# Patient Record
Sex: Female | Born: 1970 | Race: White | Hispanic: No | Marital: Married | Smoking: Never smoker
Health system: Southern US, Community
[De-identification: ages and names within clinical notes are randomized; demographics above are authoritative.]

## PROBLEM LIST (undated history)

## (undated) DIAGNOSIS — J189 Pneumonia, unspecified organism: Secondary | ICD-10-CM

## (undated) DIAGNOSIS — T7840XA Allergy, unspecified, initial encounter: Secondary | ICD-10-CM

## (undated) DIAGNOSIS — J40 Bronchitis, not specified as acute or chronic: Secondary | ICD-10-CM

## (undated) HISTORY — DX: Allergy, unspecified, initial encounter: T78.40XA

## (undated) HISTORY — DX: Bronchitis, not specified as acute or chronic: J40

## (undated) HISTORY — DX: Pneumonia, unspecified organism: J18.9

---

## 1998-09-08 HISTORY — PX: TONSILLECTOMY: SUR1361

## 2007-09-09 HISTORY — PX: CHOLECYSTECTOMY: SHX55

## 2019-08-15 ENCOUNTER — Other Ambulatory Visit: Payer: Self-pay | Admitting: *Deleted

## 2019-08-15 DIAGNOSIS — N6310 Unspecified lump in the right breast, unspecified quadrant: Secondary | ICD-10-CM

## 2019-08-18 ENCOUNTER — Telehealth (HOSPITAL_COMMUNITY): Payer: Self-pay

## 2019-08-18 NOTE — Telephone Encounter (Signed)
Telephoned patient at home number. Left voice message to call [facility].

## 2019-09-08 ENCOUNTER — Encounter (HOSPITAL_COMMUNITY): Payer: Self-pay

## 2019-09-08 ENCOUNTER — Ambulatory Visit: Payer: Self-pay

## 2019-09-08 ENCOUNTER — Ambulatory Visit
Admission: RE | Admit: 2019-09-08 | Discharge: 2019-09-08 | Disposition: A | Discharge status: Home / Self Care | Payer: No Typology Code available for payment source | Source: Ambulatory Visit | Attending: Obstetrics and Gynecology | Admitting: Obstetrics and Gynecology

## 2019-09-08 ENCOUNTER — Other Ambulatory Visit: Payer: Self-pay

## 2019-09-08 ENCOUNTER — Ambulatory Visit (HOSPITAL_COMMUNITY)
Admission: RE | Admit: 2019-09-08 | Discharge: 2019-09-08 | Disposition: A | Discharge status: Home / Self Care | Payer: Medicaid Other | Source: Ambulatory Visit | Attending: Obstetrics and Gynecology | Admitting: Obstetrics and Gynecology

## 2019-09-08 DIAGNOSIS — N6310 Unspecified lump in the right breast, unspecified quadrant: Secondary | ICD-10-CM

## 2019-09-08 DIAGNOSIS — N644 Mastodynia: Secondary | ICD-10-CM | POA: Insufficient documentation

## 2019-09-08 DIAGNOSIS — Z1239 Encounter for other screening for malignant neoplasm of breast: Secondary | ICD-10-CM

## 2019-09-08 NOTE — Patient Instructions (Addendum)
Explained breast self awareness with Felicity Coyer. Unable to complete Pap smear in [facility] today due to patient has [facility]. Patient scheduled appointment to have Pap smear completed at the free Pap smear screening on Monday, Jan 30, 2020 at 0830. Referred patient to the [facility] for a diagnostic mammogram. Appointment scheduled for Thursday, September 08, 2019 at [place]. Patient aware of appointments and will be there. Susy Flug verbalized understanding.  Markeisha Mancias, Arvil Chaco, RN 8:28 AM

## 2019-09-08 NOTE — Progress Notes (Addendum)
Complaints of right breast lump and pain x 6 months. Patient states the pain comes and goes. Patient rates the pain at a 5-6.   Pap Smear: Pap smear not completed today. Last Pap smear was 18 years ago and normal per patient. Per patient has a history of an abnormal Pap smear around 1996 that a repeat Pap smear was completed for follow-up. Patient stated she has had one or two normal Pap smears since abnormal. Unable to complete Pap smear in [facility] due to patient has [facility]. Patient scheduled appointment to have Pap smear completed at the free Pap smear screening on Monday, Jan 30, 2020 at 0830. No Pap smear results are in Epic.  Physical exam: Breasts Breasts symmetrical. No skin abnormalities bilateral breasts. No nipple retraction bilateral breasts. No nipple discharge bilateral breasts. No lymphadenopathy. No lumps palpated bilateral breasts. Unable to palpate a lump in patients area of concern. Complaints of right outer breast tenderness on exam. Referred patient to the [facility] for a diagnostic mammogram. Appointment scheduled for Thursday, September 08, 2019 at [place].        Pelvic/Bimanual No Pap smear completed today since patient has [facility] and not covered by [employer].  Smoking History: Patient has never smoked.  Patient Navigation: Patient education provided. Access to services provided for patient through [facility] program.   Breast and Cervical Cancer Risk Assessment: Patient has a family history of her mother, 2 paternal aunts, and maternal great grandmother having breast cancer. Patient has no known genetic mutations or history of radiation treatment to the chest before age 38. Patient has no history of cervical dysplasia, immunocompromised, or DES exposure in-utero.  Risk Assessment    Risk Scores      09/08/2019   Last edited by: Loletta Parish, RN   5-year risk: 1.5 %   Lifetime risk: 15.9 %

## 2020-01-30 ENCOUNTER — Ambulatory Visit: Payer: Self-pay

## 2020-07-11 ENCOUNTER — Ambulatory Visit
Admission: RE | Admit: 2020-07-11 | Discharge: 2020-07-11 | Disposition: A | Discharge status: Home / Self Care | Payer: No Typology Code available for payment source | Source: Ambulatory Visit | Attending: Family Medicine | Admitting: Family Medicine

## 2020-07-11 ENCOUNTER — Ambulatory Visit (INDEPENDENT_AMBULATORY_CARE_PROVIDER_SITE_OTHER): Payer: No Typology Code available for payment source

## 2020-07-11 VITALS — BP 157/103 | HR 110 | Temp 98.7°F | Resp 18 | Ht 65.0 in | Wt 228.0 lb

## 2020-07-11 DIAGNOSIS — J069 Acute upper respiratory infection, unspecified: Secondary | ICD-10-CM

## 2020-07-11 DIAGNOSIS — Z20822 Contact with and (suspected) exposure to covid-19: Secondary | ICD-10-CM

## 2020-07-11 DIAGNOSIS — R062 Wheezing: Secondary | ICD-10-CM

## 2020-07-11 DIAGNOSIS — R6883 Chills (without fever): Secondary | ICD-10-CM

## 2020-07-11 DIAGNOSIS — R059 Cough, unspecified: Secondary | ICD-10-CM

## 2020-07-11 DIAGNOSIS — R5383 Other fatigue: Secondary | ICD-10-CM

## 2020-07-11 DIAGNOSIS — Z01812 Encounter for preprocedural laboratory examination: Secondary | ICD-10-CM

## 2020-07-11 DIAGNOSIS — R000 Tachycardia, unspecified: Secondary | ICD-10-CM

## 2020-07-11 MED ORDER — DEXAMETHASONE SODIUM PHOSPHATE 10 MG/ML IJ SOLN
10.0000 mg | Freq: Once | INTRAMUSCULAR | Status: AC
  Administered 2020-07-11: 10 mg via INTRAMUSCULAR

## 2020-07-11 MED ORDER — BENZONATATE 100 MG PO CAPS: 100.0000 mg | ORAL_CAPSULE | Freq: Three times a day (TID) | ORAL | 0 refills | Status: DC

## 2020-07-11 MED ORDER — FLUTICASONE-SALMETEROL 250-50 MCG/DOSE IN AEPB: 1.0000 | INHALATION_SPRAY | Freq: Two times a day (BID) | RESPIRATORY_TRACT | 0 refills | Status: DC

## 2020-07-11 MED ORDER — PREDNISONE 10 MG (21) PO TBPK: ORAL_TABLET | Freq: Every day | ORAL | 0 refills | Status: AC

## 2020-07-11 MED ORDER — ALBUTEROL SULFATE HFA 108 (90 BASE) MCG/ACT IN AERS: 2.0000 | INHALATION_SPRAY | RESPIRATORY_TRACT | 0 refills | Status: DC | PRN

## 2020-07-11 MED ORDER — AZITHROMYCIN 250 MG PO TABS: 250.0000 mg | ORAL_TABLET | Freq: Every day | ORAL | 0 refills | Status: DC

## 2020-07-11 NOTE — Discharge Instructions (Addendum)
I have sent in [place] for you to use one capsule every 8 hours as needed for cough.  I have sent in an albuterol inhaler for you to use 2 puffs every 4-6 hours as needed for cough, shortness of breath, wheezing.  I have sent in an advair inhaler for you to use 2 puffs twice a day as a steroid inhaler  I have sent in azithromycin for you to take. Take 2 tablets today, then one tablet daily for the next 4 days.  I have sent in a prednisone taper for you to take for 6 days. 6 tablets on day one, 5 tablets on day two, 4 tablets on day three, 3 tablets on day four, 2 tablets on day five, and 1 tablet on day six.  Follow up with this office or with primary care if symptoms are persisting.  Follow up in the ER for high fever, trouble swallowing, trouble breathing, other concerning symptoms.

## 2020-07-11 NOTE — ED Provider Notes (Signed)
[facility]   [id number] 07/11/20 Arrival Time: 7253   CC: COVID symptoms  SUBJECTIVE: History from: patient.  Dana Randolph is a 49 y.o. female who presents with abrupt onset of nasal congestion, PND, fatigue, wheezing, and persistent dry cough for 4 days. Denies sick exposure to COVID, flu or strep. Denies recent travel. Has negative history of Covid. Has completed Covid vaccines. Has used her wife's duoneb medication x 2 since last night. Has not taken other OTC medications for this. There are no aggravating or alleviating factors. Denies previous symptoms in the past. Denies fever, sinus pain, rhinorrhea, sore throat, nausea, changes in bowel or bladder habits.    ROS: As per HPI.  All other pertinent ROS negative.     History reviewed. No pertinent past medical history. Past Surgical History:  Procedure Laterality Date  . CHOLECYSTECTOMY  2009  . TONSILLECTOMY  2000   Allergies  Allergen Reactions  . Aspirin Anaphylaxis    Throat swelling  . Penicillins Anaphylaxis    Throat swelling  . Sudafed [Pseudoephedrine] Anaphylaxis    Throat swelling  . Codeine Nausea And Vomiting   No current facility-administered medications on file prior to encounter.   No current outpatient medications on file prior to encounter.   Social History   Socioeconomic History  . Marital status: Significant Other    Spouse name: Not on file  . Number of children: Not on file  . Years of education: Not on file  . Highest education level: Bachelor's degree (e.g., BA, AB, BS)  Occupational History  . Not on file  Tobacco Use  . Smoking status: Never Smoker  . Smokeless tobacco: Never Used  Vaping Use  . Vaping Use: Never used  Substance and Sexual Activity  . Alcohol use: Yes    Comment: occ  . Drug use: Never  . Sexual activity: Not Currently  Other Topics Concern  . Not on file  Social History Narrative  . Not on file   Social Determinants of Health   Financial  Resource Strain:   . Difficulty of Paying Living Expenses: Not on file  Food Insecurity:   . Worried About Charity fundraiser in the Last Year: Not on file  . Ran Out of Food in the Last Year: Not on file  Transportation Needs: No Transportation Needs  . Lack of Transportation (Medical): No  . Lack of Transportation (Non-Medical): No  Physical Activity:   . Days of Exercise per Week: Not on file  . Minutes of Exercise per Session: Not on file  Stress:   . Feeling of Stress : Not on file  Social Connections:   . Frequency of Communication with Friends and Family: Not on file  . Frequency of Social Gatherings with Friends and Family: Not on file  . Attends Religious Services: Not on file  . Active Member of Clubs or Organizations: Not on file  . Attends Archivist Meetings: Not on file  . Marital Status: Not on file  Intimate Partner Violence:   . Fear of Current or Ex-Partner: Not on file  . Emotionally Abused: Not on file  . Physically Abused: Not on file  . Sexually Abused: Not on file   Family History  Problem Relation Age of Onset  . Breast cancer Mother   . Diabetes Father   . COPD Father   . Hypertension Father   . Heart attack Father   . Breast cancer Paternal Aunt   . Breast cancer  Paternal Aunt   . Breast cancer Maternal Great-grandmother     OBJECTIVE:  Vitals:   07/11/20 1258 07/11/20 1259  BP: (!) 157/103   Pulse: (!) 110   Resp: 18   Temp: 98.7 F (37.1 C)   TempSrc: Oral   SpO2: 95%   Weight:  228 lb (103.4 kg)  Height:  5\' 5"  (1.651 m)     General appearance: alert; appears fatigued, but nontoxic; speaking in full sentences and tolerating own secretions HEENT: NCAT; Ears: EACs clear, TMs pearly gray; Eyes: PERRL.  EOM grossly intact. Sinuses: nontender; Nose: nares patent without rhinorrhea, Throat: oropharynx erythematous, tonsils non erythematous or enlarged, uvula midline  Neck: supple without LAD Lungs: unlabored respirations,  symmetrical air entry; cough: moderate; no respiratory distress; wheezing to bilateral upper lobes, diminished lung sounds throughout the rest of the lungs Heart: regular rate and rhythm.  Radial pulses 2+ symmetrical bilaterally Skin: warm and dry Psychological: alert and cooperative; normal mood and affect  LABS:  No results found for this or any previous visit (from the past 24 hour(s)).   ASSESSMENT & PLAN:  1. Upper respiratory tract infection, unspecified type   2. Cough   3. Other fatigue   4. Wheezing   5. Chills   6. Tachycardia   7. Encounter for preoperative screening laboratory testing for COVID-19 virus     Meds ordered this encounter  Medications  . dexamethasone (DECADRON) injection 10 mg  . albuterol (VENTOLIN HFA) 108 (90 Base) MCG/ACT inhaler    Sig: Inhale 2 puffs into the lungs every 4 (four) hours as needed for wheezing or shortness of breath.    Dispense:  18 g    Refill:  0    Order Specific Question:   Supervising Provider    Answer:   Chase Picket [id number]  . Fluticasone-Salmeterol (ADVAIR DISKUS) 250-50 MCG/DOSE AEPB    Sig: Inhale 1 puff into the lungs in the morning and at bedtime.    Dispense:  60 each    Refill:  0    Order Specific Question:   Supervising Provider    Answer:   Chase Picket [id number]  . benzonatate (TESSALON) 100 MG capsule    Sig: Take 1 capsule (100 mg total) by mouth every 8 (eight) hours.    Dispense:  21 capsule    Refill:  0    Order Specific Question:   Supervising Provider    Answer:   Chase Picket [id number]  . azithromycin (ZITHROMAX) 250 MG tablet    Sig: Take 1 tablet (250 mg total) by mouth daily. Take first 2 tablets together, then 1 every day until finished.    Dispense:  6 tablet    Refill:  0    Order Specific Question:   Supervising Provider    Answer:   Chase Picket [id number]  . predniSONE (STERAPRED UNI-PAK 21 TAB) 10 MG (21) TBPK tablet    Sig: Take by mouth daily for 6 days. Take 6  tablets on day 1, 5 tablets on day 2, 4 tablets on day 3, 3 tablets on day 4, 2 tablets on day 5, 1 tablet on day 6    Dispense:  21 tablet    Refill:  0    Order Specific Question:   Supervising Provider    Answer:   Chase Picket [id number]    Decadron 10mg  IM in office today Albuterol prescribed Advair prescribed Steroid taper prescribed Tessalon Perles prescribed Azithromycin prescribed  COVID/FLU/RSV testing ordered.  It will take between 1-2 days for test results.  Someone will contact you regarding abnormal results.   Patient should remain in quarantine until they have received Covid results.  If negative you may resume normal activities (go back to work/school) while practicing hand hygiene, social distance, and mask wearing.  If positive, patient should remain in quarantine for 10 days from symptom onset AND greater than 72 hours after symptoms resolution (absence of fever without the use of fever-reducing medication and improvement in respiratory symptoms), whichever is longer Get plenty of rest and push fluids Use OTC zyrtec for nasal congestion, runny nose, and/or sore throat Use OTC flonase for nasal congestion and runny nose Use medications daily for symptom relief Use OTC medications like ibuprofen or tylenol as needed fever or pain Call or go to the ED if you have any new or worsening symptoms such as fever, worsening cough, shortness of breath, chest tightness, chest pain, turning blue, changes in mental status.  Reviewed expectations re: course of current medical issues. Questions answered. Outlined signs and symptoms indicating need for more acute intervention. Patient verbalized understanding. After Visit Summary given.         Faustino Congress, NP 07/11/20 1356

## 2020-07-11 NOTE — ED Triage Notes (Signed)
Pt reports having cough, runny nose and fatigue x4 days. This morning began to have some wheezing. No known covid exposure.

## 2020-07-13 LAB — COVID-19, FLU A+B AND RSV
Influenza A, NAA: NOT DETECTED
Influenza B, NAA: NOT DETECTED
RSV, NAA: NOT DETECTED
SARS-CoV-2, NAA: NOT DETECTED

## 2020-07-23 ENCOUNTER — Encounter: Payer: No Typology Code available for payment source | Admitting: Certified Nurse Midwife

## 2020-08-22 ENCOUNTER — Other Ambulatory Visit (HOSPITAL_COMMUNITY)
Admission: RE | Admit: 2020-08-22 | Discharge: 2020-08-22 | Disposition: A | Discharge status: Home / Self Care | Payer: Medicaid Other | Source: Ambulatory Visit | Attending: Certified Nurse Midwife | Admitting: Certified Nurse Midwife

## 2020-08-22 ENCOUNTER — Ambulatory Visit (INDEPENDENT_AMBULATORY_CARE_PROVIDER_SITE_OTHER): Payer: Medicaid Other | Admitting: Certified Nurse Midwife

## 2020-08-22 ENCOUNTER — Other Ambulatory Visit: Payer: Self-pay

## 2020-08-22 ENCOUNTER — Encounter: Payer: Self-pay | Admitting: Certified Nurse Midwife

## 2020-08-22 VITALS — BP 122/85 | HR 92 | Ht 65.0 in | Wt 252.3 lb

## 2020-08-22 DIAGNOSIS — Z124 Encounter for screening for malignant neoplasm of cervix: Secondary | ICD-10-CM | POA: Diagnosis present

## 2020-08-22 DIAGNOSIS — Z01419 Encounter for gynecological examination (general) (routine) without abnormal findings: Secondary | ICD-10-CM | POA: Insufficient documentation

## 2020-08-22 DIAGNOSIS — Z1231 Encounter for screening mammogram for malignant neoplasm of breast: Secondary | ICD-10-CM

## 2020-08-22 NOTE — Patient Instructions (Signed)

## 2020-08-22 NOTE — Progress Notes (Signed)
GYNECOLOGY ANNUAL PREVENTATIVE CARE ENCOUNTER NOTE  History:     Dana Randolph is a 49 y.o. G3P0 female here for a routine annual gynecologic exam.  Current complaints: heavy painful periods .   Denies abnormal vaginal bleeding, discharge, pelvic pain, problems with intercourse or other gynecologic concerns.     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 Contraception: none Last Pap: a long time ago negative per pt  Lasst mammogram: 09/08/19 Results were: normal  Obstetric History OB History  Gravida Para Term Preterm AB Living  3            SAB IAB Ectopic Multiple Live Births          3    # Outcome Date GA Lbr Len/2nd Randolph Sex Delivery Anes PTL Lv  3 Gravida           2 Gravida           1 Gravida             No past medical history on file.  Past Surgical History:  Procedure Laterality Date  . CHOLECYSTECTOMY  2009  . TONSILLECTOMY  2000    Current Outpatient Medications on File Prior to Visit  Medication Sig Dispense Refill  . albuterol (VENTOLIN HFA) 108 (90 Base) MCG/ACT inhaler Inhale 2 puffs into the lungs every 4 (four) hours as needed for wheezing or shortness of breath. 18 g 0  . Fluticasone-Salmeterol (ADVAIR DISKUS) 250-50 MCG/DOSE AEPB Inhale 1 puff into the lungs in the morning and at bedtime. 60 each 0   No current facility-administered medications on file prior to visit.    Allergies  Allergen Reactions  . Aspirin Anaphylaxis    Throat swelling  . Penicillins Anaphylaxis    Throat swelling  . Sudafed [Pseudoephedrine] Anaphylaxis    Throat swelling  . Codeine Nausea And Vomiting  . Erythromycin     Social History:  reports that she has never smoked. She has never used smokeless tobacco. She reports current alcohol use. She reports that she does not use  drugs.  Family History  Problem Relation Age of Onset  . Breast cancer Mother   . Diabetes Father   . COPD Father   . Hypertension Father   . Heart attack Father   . Breast cancer Paternal Aunt   . Breast cancer Paternal Aunt   . Breast cancer Maternal Great-grandmother     The following portions of the patient's history were reviewed and updated as appropriate: allergies, current medications, past family history, past medical history, past social history, past surgical history and problem list.  Review of Systems Pertinent items noted in HPI and remainder of comprehensive ROS otherwise negative.  Physical Exam:  BP 122/85   Pulse 92   Ht 5\' 5"  (1.651 m)   Wt 252 lb 5 oz (114.4 kg)   LMP 07/01/2020 (Approximate)   BMI 41.99 kg/m  CONSTITUTIONAL: Well-developed, well-nourished, obese female in no acute distress.  HENT:  Normocephalic, atraumatic, External right and left ear normal. Oropharynx is clear and moist EYES: Conjunctivae and EOM are normal. Pupils are equal, round, and reactive to light. No scleral icterus.  NECK: Normal range of motion, supple, no masses.  Normal thyroid.  SKIN: Skin is warm and dry. No rash noted. Not diaphoretic. No erythema. No pallor. MUSCULOSKELETAL: Normal range of motion. No tenderness.  No  cyanosis, clubbing, or edema.  2+ distal pulses. NEUROLOGIC: Alert and oriented to person, place, and time. Normal reflexes, muscle tone coordination.  PSYCHIATRIC: Normal mood and affect. Normal behavior. Normal judgment and thought content. CARDIOVASCULAR: Normal heart rate noted, regular rhythm RESPIRATORY: Clear to auscultation bilaterally. Effort and breath sounds normal, no problems with respiration noted. BREASTS: Symmetric in size. No masses, tenderness, skin changes, nipple drainage, or lymphadenopathy bilaterally.  ABDOMEN: Soft, no distention noted.  No tenderness, rebound or guarding.  PELVIC: Normal appearing external genitalia and urethral  meatus; normal appearing vaginal mucosa and cervix.  No abnormal discharge noted.  Pap smear obtained.  Normal uterine size, no other palpable masses, no uterine or adnexal tenderness.  .   Assessment and Plan:    1. Women's annual routine gynecological examination   Pap:Will follow up results of pap smear and manage accordingly. Mammogram :ordered  Labs:has done with PCP, declines  Refills:None Referral:none Discussed BC options to manage periods. Is interested in IUD. Reviewed risks, beneifits and contraindications to use. Discussed procedure.  Routine preventative health maintenance measures emphasized. Please refer to After Visit Summary for other counseling recommendations.      Philip Aspen, CNM [facility]

## 2020-08-29 LAB — CYTOLOGY - PAP
Comment: NEGATIVE
Diagnosis: NEGATIVE
High risk HPV: NEGATIVE

## 2020-10-08 ENCOUNTER — Ambulatory Visit (INDEPENDENT_AMBULATORY_CARE_PROVIDER_SITE_OTHER): Payer: Medicaid Other | Admitting: Certified Nurse Midwife

## 2020-10-08 ENCOUNTER — Other Ambulatory Visit: Payer: Self-pay

## 2020-10-08 ENCOUNTER — Encounter: Payer: Self-pay | Admitting: Certified Nurse Midwife

## 2020-10-08 VITALS — BP 115/79 | HR 74 | Ht 65.0 in | Wt 248.1 lb

## 2020-10-08 DIAGNOSIS — Z30430 Encounter for insertion of intrauterine contraceptive device: Secondary | ICD-10-CM

## 2020-10-08 DIAGNOSIS — Z3202 Encounter for pregnancy test, result negative: Secondary | ICD-10-CM

## 2020-10-08 LAB — POCT URINE PREGNANCY: Preg Test, Ur: NEGATIVE

## 2020-10-08 NOTE — Progress Notes (Signed)
     GYNECOLOGY OFFICE PROCEDURE NOTE  Dana Randolph is a 50 y.o. [phone] here for mirena IUD insertion. No GYN concerns.  Last pap smear was on 08/22/20 and was normal.  IUD Insertion Procedure Note Patient identified, informed consent performed, consent signed.   Discussed risks of irregular bleeding, cramping, infection, malpositioning or misplacement of the IUD outside the uterus which may require further procedure such as laparoscopy. Also discussed >99% contraception efficacy, increased risk of ectopic pregnancy with failure of method.   Emphasized that this did not protect against STIs, condoms recommended during all sexual encounters. Time out was performed.  Urine pregnancy test negative.  Speculum placed in the vagina.  Cervix visualized.  Cleaned with Betadine x 2.  Grasped anteriorly with a single tooth tenaculum.  Uterus sounded to 6 cm.  Mirena IUD placed per manufacturer's recommendations.  Strings trimmed to 3 cm. Tenaculum was removed, good hemostasis noted.  Patient tolerated procedure well.   Patient was given post-procedure instructions.  She was advised to have backup contraception for one week.  Patient was also asked to check IUD strings periodically and follow up in 4 weeks for IUD check.   Philip Aspen, CNM

## 2020-10-08 NOTE — Patient Instructions (Signed)

## 2020-11-01 ENCOUNTER — Telehealth: Payer: Self-pay

## 2020-11-02 ENCOUNTER — Other Ambulatory Visit: Payer: Medicaid Other

## 2020-11-07 ENCOUNTER — Encounter: Payer: No Typology Code available for payment source | Admitting: Certified Nurse Midwife

## 2020-11-07 NOTE — Telephone Encounter (Signed)
Error

## 2020-11-22 ENCOUNTER — Encounter: Payer: Self-pay | Admitting: Certified Nurse Midwife

## 2021-01-07 ENCOUNTER — Other Ambulatory Visit: Payer: Self-pay | Admitting: Certified Nurse Midwife

## 2021-01-07 DIAGNOSIS — N939 Abnormal uterine and vaginal bleeding, unspecified: Secondary | ICD-10-CM

## 2021-01-11 ENCOUNTER — Other Ambulatory Visit: Payer: Medicaid Other

## 2021-01-11 ENCOUNTER — Other Ambulatory Visit: Payer: Self-pay

## 2021-01-11 DIAGNOSIS — N939 Abnormal uterine and vaginal bleeding, unspecified: Secondary | ICD-10-CM

## 2021-01-12 LAB — CBC
Hematocrit: 39.7 % (ref 34.0–46.6)
Hemoglobin: 13 g/dL (ref 11.1–15.9)
MCH: 26.4 pg — ABNORMAL LOW (ref 26.6–33.0)
MCHC: 32.7 g/dL (ref 31.5–35.7)
MCV: 81 fL (ref 79–97)
Platelets: 332 10*3/uL (ref 150–450)
RBC: 4.92 x10E6/uL (ref 3.77–5.28)
RDW: 12.9 % (ref 11.7–15.4)
WBC: 9.7 10*3/uL (ref 3.4–10.8)

## 2021-01-14 NOTE — Telephone Encounter (Signed)
Patient has an appointment scheduled for Wednesday.

## 2021-01-16 ENCOUNTER — Other Ambulatory Visit: Payer: Self-pay

## 2021-01-16 ENCOUNTER — Ambulatory Visit (INDEPENDENT_AMBULATORY_CARE_PROVIDER_SITE_OTHER): Payer: Medicaid Other | Admitting: Certified Nurse Midwife

## 2021-01-16 VITALS — BP 127/87 | HR 99 | Resp 16 | Ht 65.0 in | Wt 246.3 lb

## 2021-01-16 DIAGNOSIS — N939 Abnormal uterine and vaginal bleeding, unspecified: Secondary | ICD-10-CM

## 2021-01-16 DIAGNOSIS — Z30432 Encounter for removal of intrauterine contraceptive device: Secondary | ICD-10-CM | POA: Diagnosis not present

## 2021-01-16 NOTE — Progress Notes (Signed)
   GYNECOLOGY OFFICE PROCEDURE NOTE  Dana Randolph is a 50 y.o. [phone] here for mirena IUD removal. No GYN concerns.  Last pap smear was on 08/22/2020 and was normal. Pt states she had continued to bleed every day since having it in and wants it removed. She had it placed to help with her heavy periods.   IUD Removal  Patient identified, informed consent performed, consent signed.  Patient was in the dorsal lithotomy position, normal external genitalia was noted.  A speculum was placed in the patient's vagina, normal discharge was noted, no lesions. The cervix was visualized, no lesions, no abnormal discharge.  The strings of the IUD were grasped and pulled using ring forceps. The IUD was removed in its entirety.  Patient tolerated the procedure well.    Patient does not require contraception as she is in relationship with female partner. Is interested in bleeding control. We disused other birth control option and use of lysteda or progesterones to control bleeding. Discussed use of ablation vs hysterectomy. Recommend consult with MD to discuss further. She verbalizes and agrees. Routine preventative health maintenance measures emphasized.   Philip Aspen, CNM

## 2021-01-24 ENCOUNTER — Encounter: Payer: Self-pay | Admitting: Obstetrics and Gynecology

## 2021-01-24 ENCOUNTER — Ambulatory Visit (INDEPENDENT_AMBULATORY_CARE_PROVIDER_SITE_OTHER): Payer: Self-pay | Admitting: Obstetrics and Gynecology

## 2021-01-24 ENCOUNTER — Other Ambulatory Visit: Payer: Self-pay

## 2021-01-24 VITALS — BP 121/83 | HR 106 | Ht 65.0 in | Wt 250.2 lb

## 2021-01-24 DIAGNOSIS — N939 Abnormal uterine and vaginal bleeding, unspecified: Secondary | ICD-10-CM

## 2021-01-24 NOTE — Progress Notes (Signed)
Pt is present to discuss endo ablation for prolonged cycles. Pt stated that her cycles started and has been on since a week after getting the IUD on October 08, 2020. Pt reported severe cramping, pain, prolonged cycles 120 days +, weakness, and heavy cycles.

## 2021-01-24 NOTE — Progress Notes (Signed)
[facility] [address] [place], [place]  [address] Phone:  [phone]   Fax:  [phone] Subjective:     Dana Randolph is a 50 y.o. woman who presents as a referral from Philip Aspen for abnormal uterine bleeding.  She reports that her cycles have been normal up until the past 5 or 6 years ago.  Began noticing heavier cycles with passage of clots.  Cycles were lasting 5-6 days.  She does report associated mild to moderate dysmenorrhea.  Over the last 3-4 months she has been bleeding daily, alternating between light and heavy flow (can get up to wearing super plus tampon and a pad on heaviest days, changing every 1-2 hours).  Attempted to use the the IUD for management of her heavy cycles in January, however had it removed in May due to persistent daily bleeding.  Her last Pap smear was 08/22/2020, and was normal.   She desires to discuss surgical management that with endometrial ablation.   The following portions of the patient's history were reviewed and updated as appropriate:  She  has a past medical history of Bronchitis and Pneumonia.   She  has a past surgical history that includes Tonsillectomy (2000) and Cholecystectomy (2009).   Her family history includes Breast cancer in her maternal great-grandmother, paternal aunt, and paternal aunt; COPD in her father; Diabetes in her father; Heart attack in her father; Hypertension in her father; Lung cancer in her mother.   She  reports that she has never smoked. She has never used smokeless tobacco. She reports current alcohol use. She reports that she does not use drugs.   Current Outpatient Medications on File Prior to Visit  Medication Sig Dispense Refill  . acetaminophen (TYLENOL) 500 MG tablet Take 500 mg by mouth every 6 (six) hours as needed.    Marland Kitchen albuterol (VENTOLIN HFA) 108 (90 Base) MCG/ACT inhaler Inhale 2 puffs into the lungs every 4 (four) hours as needed for wheezing or shortness of breath. 18 g 0    No current facility-administered medications on file prior to visit.   She is allergic to aspirin, penicillins, sudafed [pseudoephedrine], codeine, and erythromycin..  Review of Systems Pertinent items noted in HPI and remainder of comprehensive ROS otherwise negative.     Objective:    BP 121/83   Pulse (!) 106   Ht 5\' 5"  (1.651 m)   Wt 250 lb 3.2 oz (113.5 kg)   LMP  (LMP Unknown)   BMI 41.64 kg/m  General appearance: alert, no distress and morbidly obese   Remainder of exam deferred (refer to Annie's exam on 08/22/2020, normal).   Assessment:   1. Abnormal uterine bleeding   2. Obesity, Class III, BMI 40-49.9 (morbid obesity) ([place])    Plan:   - Patient has abnormal uterine bleeding . She has a prior normal exam, no evidence of lesions.  Will order abnormal uterine bleeding evaluation labs and pelvic ultrasound to evaluate for any structural gynecologic abnormalities.  Will have patient return in 2 weeks for discussion of these results and plans for further evaluation/management.  Discussed the need for endometrial biopsy in light of her abnormal bleeding, especially as she has risk factor of obesity and failed response to IUD therapy.  I will perform an exam with endometrial biopsy at her next visit. -Patient desires surgical management with endometrial ablation.  The risks of surgery were discussed in detail with the patient including but not limited to: bleeding which may require transfusion  or reoperation; infection which may require prolonged hospitalization or re-hospitalization and antibiotic therapy; injury to bowel, bladder, ureters and major vessels or other surrounding organs; formation of adhesions; need for additional procedures including laparotomy or subsequent procedures secondary to abnormal pathology; thromboembolic phenomenon; incisional problems and other postoperative or anesthesia complications.  Patient was told that the likelihood that her condition and  symptoms will be treated effectively with this surgical management was very high; the postoperative expectations were also discussed in detail. The patient also understands the alternative treatment options which were discussed in full. All questions were answered.  She was told that she will be contacted by our surgical scheduler regarding the time and date of her surgery, tentatively desires February 18, 2021; routine preoperative instructions will be given to her by the preoperative nursing team.   She is aware of need for preoperative COVID testing and subsequent quarantine from time of test to time of surgery; she will be given further preoperative instructions at that [facility] screening visit.  Patient education handouts about the procedure were given to the patient to review at home.  RTC in 2 weeks for pre-op exam and endometrial biopsy.   A total of 25 minutes were spent face-to-face with the patient during this encounter and over half of that time involved counseling and coordination of care.

## 2021-01-25 LAB — ESTRADIOL: Estradiol: 120 pg/mL

## 2021-01-25 LAB — FSH/LH
FSH: 10.9 m[IU]/mL
LH: 26.5 m[IU]/mL

## 2021-01-25 LAB — TSH: TSH: 0.882 u[IU]/mL (ref 0.450–4.500)

## 2021-01-25 LAB — PROGESTERONE: Progesterone: 1.1 ng/mL

## 2021-02-07 ENCOUNTER — Ambulatory Visit (HOSPITAL_COMMUNITY): Admission: RE | Admit: 2021-02-07 | Payer: Medicaid Other | Source: Ambulatory Visit

## 2021-02-07 ENCOUNTER — Encounter: Payer: Medicaid Other | Admitting: Obstetrics and Gynecology

## 2021-02-11 ENCOUNTER — Other Ambulatory Visit: Payer: Medicaid Other

## 2021-02-18 ENCOUNTER — Ambulatory Visit
Admission: RE | Admit: 2021-02-18 | Payer: Medicaid Other | Source: Home / Self Care | Admitting: Obstetrics and Gynecology

## 2021-02-18 ENCOUNTER — Encounter: Admission: RE | Payer: Self-pay | Source: Home / Self Care

## 2021-02-18 SURGERY — DILATATION AND CURETTAGE/HYSTEROSCOPY WITH MINERVA
Anesthesia: General

## 2021-03-28 IMAGING — DX DG CHEST 2V
2 series · 2 of 2 positions shown · non-contrast
Comparison: None.

CLINICAL DATA: cough, chills, fatigue, hx pneumonia

EXAM:
CHEST - 2 VIEW

[chest pa]
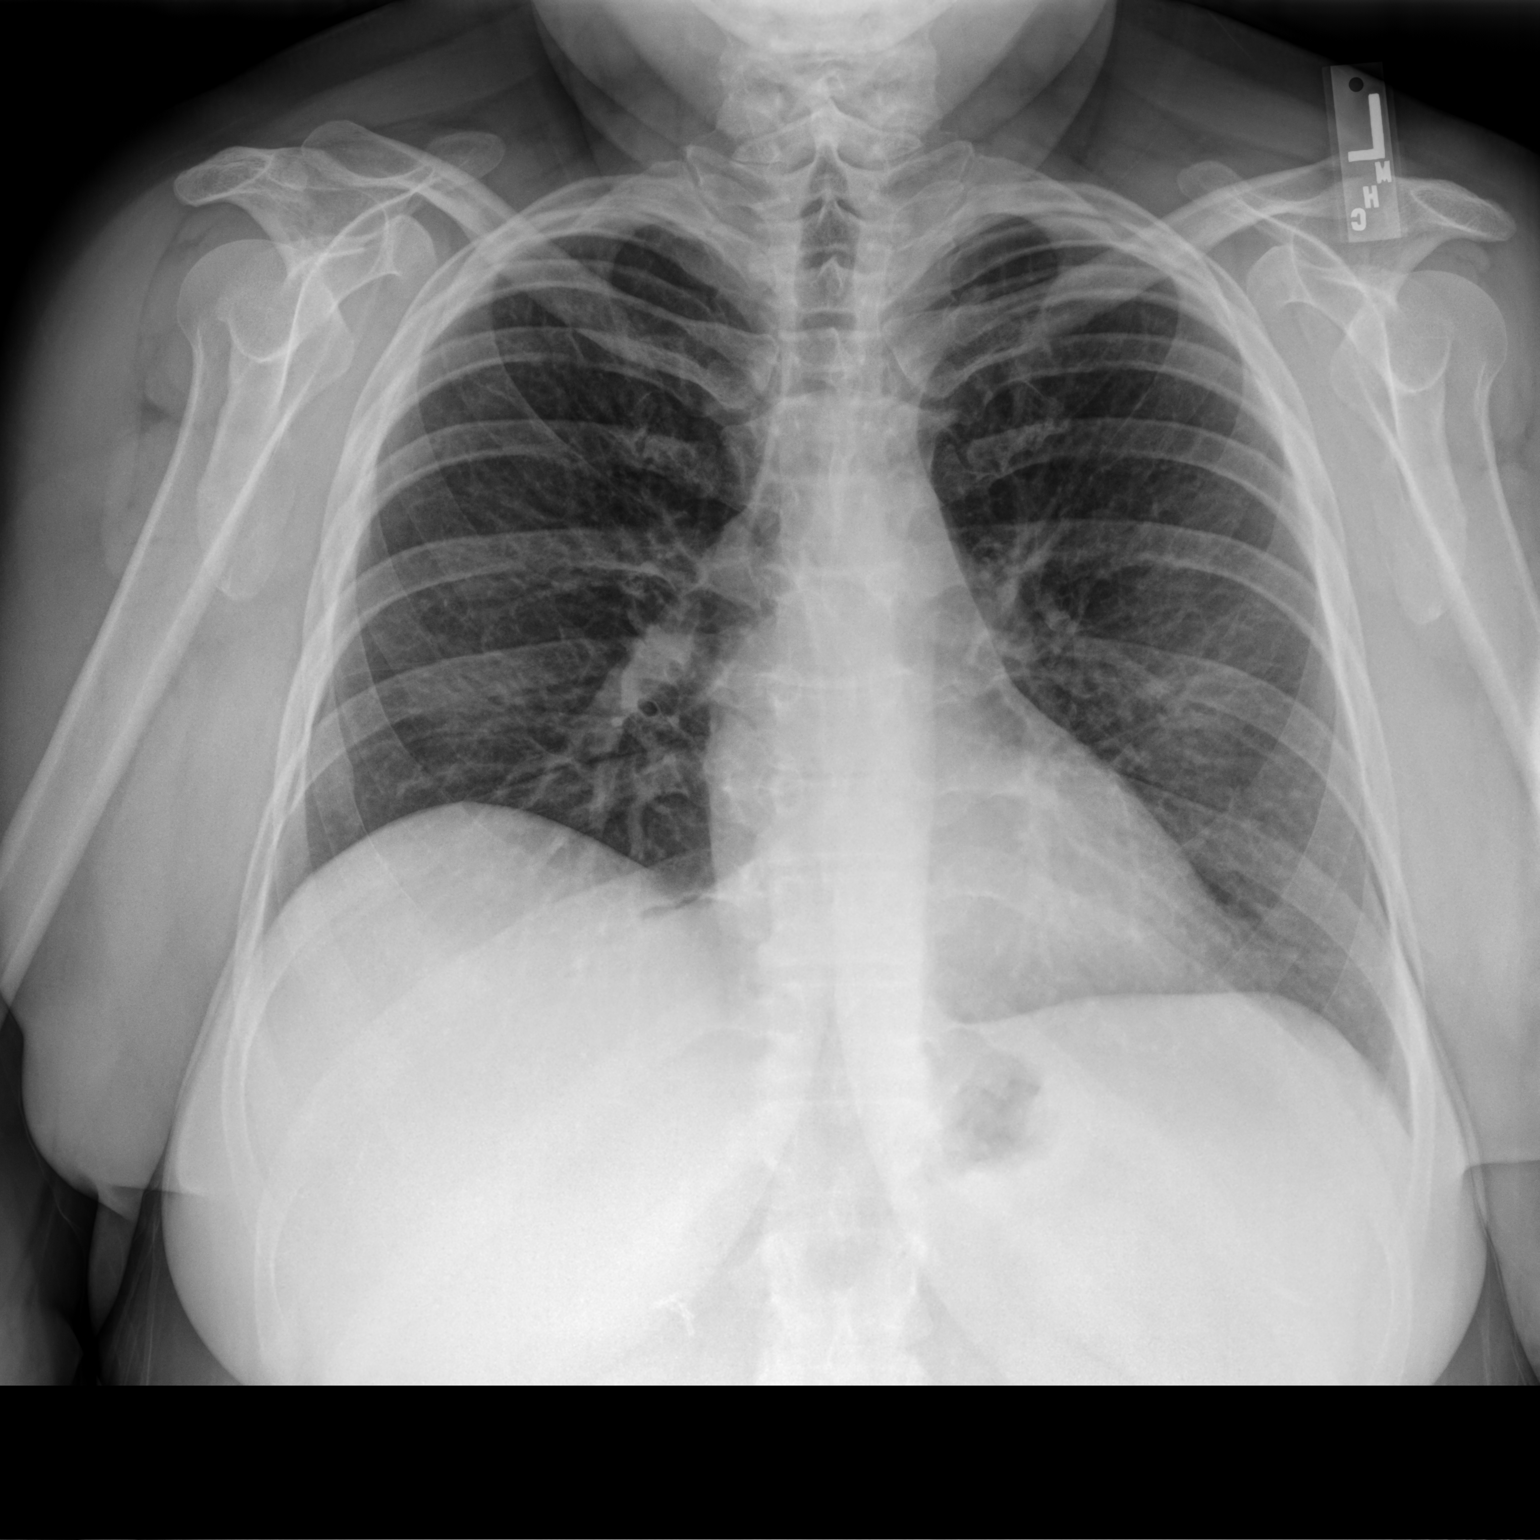

[chest lat]
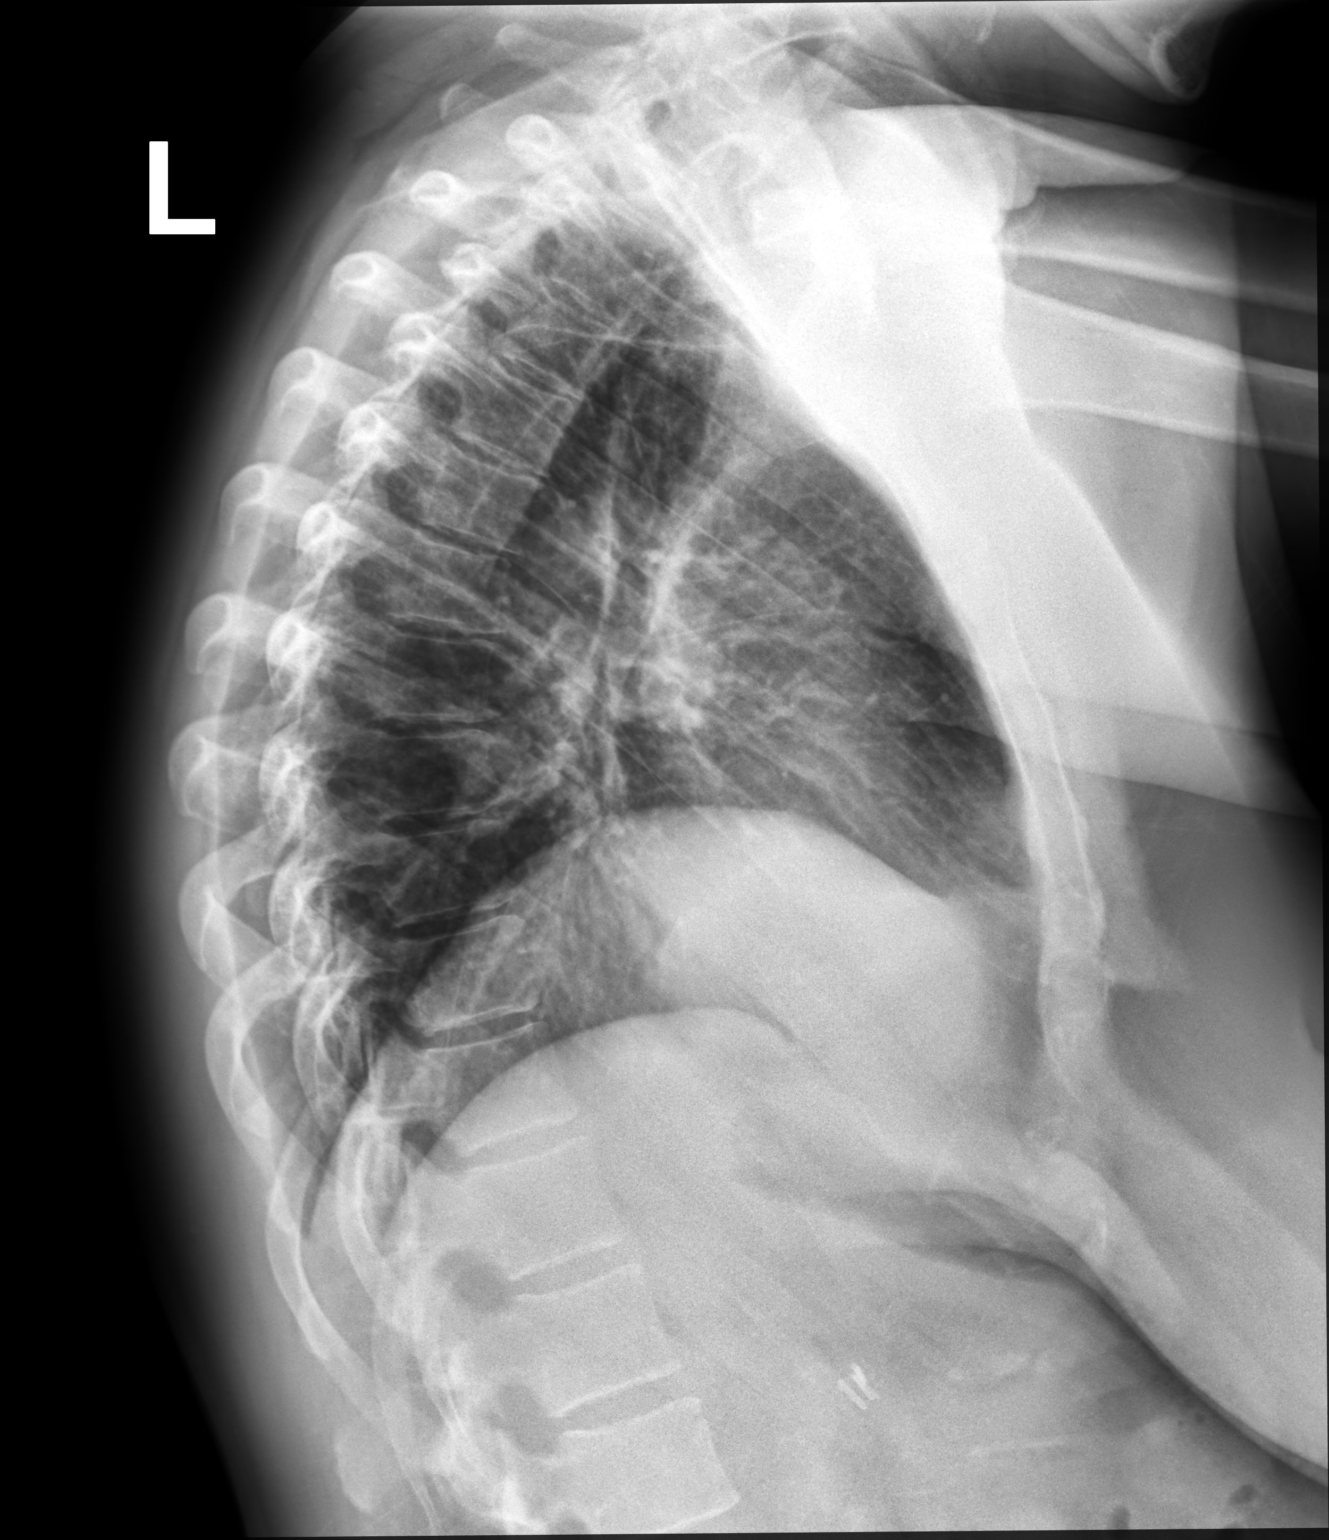

[2 of 2 positions shown; findings below may reference images not displayed]

FINDINGS: The heart size and mediastinal contours are within normal limits.
Hypoinflated lungs. Both lungs are clear. No pneumothorax or pleural
effusion. The visualized skeletal structures are unremarkable.
IMPRESSION: No focal airspace disease.

## 2021-05-14 ENCOUNTER — Encounter: Payer: Self-pay | Admitting: Emergency Medicine

## 2021-05-14 ENCOUNTER — Emergency Department: Payer: Self-pay

## 2021-05-14 ENCOUNTER — Emergency Department
Admission: EM | Admit: 2021-05-14 | Discharge: 2021-05-14 | Disposition: A | Discharge status: Home / Self Care | Payer: Self-pay | Attending: Emergency Medicine | Admitting: Emergency Medicine

## 2021-05-14 ENCOUNTER — Other Ambulatory Visit: Payer: Self-pay

## 2021-05-14 DIAGNOSIS — N9489 Other specified conditions associated with female genital organs and menstrual cycle: Secondary | ICD-10-CM | POA: Insufficient documentation

## 2021-05-14 DIAGNOSIS — N939 Abnormal uterine and vaginal bleeding, unspecified: Secondary | ICD-10-CM | POA: Insufficient documentation

## 2021-05-14 DIAGNOSIS — R1030 Lower abdominal pain, unspecified: Secondary | ICD-10-CM | POA: Insufficient documentation

## 2021-05-14 DIAGNOSIS — R109 Unspecified abdominal pain: Secondary | ICD-10-CM | POA: Insufficient documentation

## 2021-05-14 LAB — CBC WITH DIFFERENTIAL/PLATELET
Abs Immature Granulocytes: 0.05 10*3/uL (ref 0.00–0.07)
Basophils Absolute: 0 10*3/uL (ref 0.0–0.1)
Basophils Relative: 0 %
Eosinophils Absolute: 0.2 10*3/uL (ref 0.0–0.5)
Eosinophils Relative: 2 %
HCT: 36.6 % (ref 36.0–46.0)
Hemoglobin: 12.2 g/dL (ref 12.0–15.0)
Immature Granulocytes: 1 %
Lymphocytes Relative: 20 %
Lymphs Abs: 1.9 10*3/uL (ref 0.7–4.0)
MCH: 26.9 pg (ref 26.0–34.0)
MCHC: 33.3 g/dL (ref 30.0–36.0)
MCV: 80.8 fL (ref 80.0–100.0)
Monocytes Absolute: 0.6 10*3/uL (ref 0.1–1.0)
Monocytes Relative: 7 %
Neutro Abs: 6.5 10*3/uL (ref 1.7–7.7)
Neutrophils Relative %: 70 %
Platelets: 280 10*3/uL (ref 150–400)
RBC: 4.53 MIL/uL (ref 3.87–5.11)
RDW: 13.8 % (ref 11.5–15.5)
WBC: 9.3 10*3/uL (ref 4.0–10.5)
nRBC: 0 % (ref 0.0–0.2)

## 2021-05-14 LAB — COMPREHENSIVE METABOLIC PANEL
ALT: 14 U/L (ref 0–44)
AST: 16 U/L (ref 15–41)
Albumin: 3.4 g/dL — ABNORMAL LOW (ref 3.5–5.0)
Alkaline Phosphatase: 72 U/L (ref 38–126)
Anion gap: 8 (ref 5–15)
BUN: 18 mg/dL (ref 6–20)
CO2: 24 mmol/L (ref 22–32)
Calcium: 8.4 mg/dL — ABNORMAL LOW (ref 8.9–10.3)
Chloride: 106 mmol/L (ref 98–111)
Creatinine, Ser: 0.76 mg/dL (ref 0.44–1.00)
GFR, Estimated: >60 mL/min (ref >60)
Glucose, Bld: 149 mg/dL — ABNORMAL HIGH (ref 70–99)
Potassium: 4 mmol/L (ref 3.5–5.1)
Sodium: 138 mmol/L (ref 135–145)
Total Bilirubin: 1.2 mg/dL (ref 0.3–1.2)
Total Protein: 6.2 g/dL — ABNORMAL LOW (ref 6.5–8.1)

## 2021-05-14 LAB — URINALYSIS, ROUTINE W REFLEX MICROSCOPIC
Bilirubin Urine: NEGATIVE
Glucose, UA: NEGATIVE mg/dL
Ketones, ur: NEGATIVE mg/dL
Nitrite: NEGATIVE
Protein, ur: 30 mg/dL — AB
Specific Gravity, Urine: 1.025 (ref 1.005–1.030)
pH: 7 (ref 5.0–8.0)

## 2021-05-14 LAB — HCG, QUANTITATIVE, PREGNANCY: hCG, Beta Chain, Quant, S: 1 m[IU]/mL (ref <5)

## 2021-05-14 LAB — PROTIME-INR
INR: 1 (ref 0.8–1.2)
Prothrombin Time: 13.6 seconds (ref 11.4–15.2)

## 2021-05-14 LAB — URINALYSIS, MICROSCOPIC (REFLEX)
Bacteria, UA: NONE SEEN
RBC / HPF: >50 RBC/hpf (ref 0–5)

## 2021-05-14 LAB — TYPE AND SCREEN
ABO/RH(D): A POS
Antibody Screen: NEGATIVE

## 2021-05-14 LAB — APTT: aPTT: 29 seconds (ref 24–36)

## 2021-05-14 LAB — POC URINE PREG, ED: Preg Test, Ur: NEGATIVE

## 2021-05-14 MED ORDER — FENTANYL CITRATE PF 50 MCG/ML IJ SOSY
50.0000 ug | PREFILLED_SYRINGE | Freq: Once | INTRAMUSCULAR | Status: AC
  Administered 2021-05-14: 50 ug via INTRAVENOUS
  Filled 2021-05-14: qty 1

## 2021-05-14 MED ORDER — FOSFOMYCIN TROMETHAMINE 3 G PO PACK
3.0000 g | PACK | Freq: Once | ORAL | Status: AC
  Administered 2021-05-14: 3 g via ORAL
  Filled 2021-05-14: qty 3

## 2021-05-14 MED ORDER — FENTANYL CITRATE PF 50 MCG/ML IJ SOSY
50.0000 ug | PREFILLED_SYRINGE | Freq: Once | INTRAMUSCULAR | Status: DC
Start: 2021-05-14 — End: 2021-05-14

## 2021-05-14 MED ORDER — MEDROXYPROGESTERONE ACETATE 10 MG PO TABS: 10.0000 mg | ORAL_TABLET | Freq: Every day | ORAL | 0 refills | Status: DC

## 2021-05-14 MED ORDER — ONDANSETRON HCL 4 MG/2ML IJ SOLN
4.0000 mg | Freq: Once | INTRAMUSCULAR | Status: AC
  Administered 2021-05-14: 4 mg via INTRAVENOUS
  Filled 2021-05-14: qty 2

## 2021-05-14 MED ORDER — ACETAMINOPHEN 500 MG PO TABS
1000.0000 mg | ORAL_TABLET | Freq: Once | ORAL | Status: AC
  Administered 2021-05-14: 1000 mg via ORAL
  Filled 2021-05-14: qty 2

## 2021-05-14 NOTE — ED Provider Notes (Signed)
[facility] Emergency Department Provider Note  ____________________________________________   Event Date/Time   First MD Initiated Contact with Patient 05/14/21 [phone]     (approximate)  I have reviewed the triage vital signs and the nursing notes.   HISTORY  Chief Complaint Vaginal Bleeding    HPI Dana Randolph is a 50 y.o. female with abnormal uterine bleeding who comes in with concerns for heavy menstruation.  Patient reports a history of abnormal bleeding and had an IUD that was not successful and so it was removed.  She states that she had a normal period a month ago but then today started having another period yesterday.  They report severe amount of bleeding, constant, nothing makes it better or worse that started yesterday.  Is associate with some lower abdominal cramping as well.  States that she did not want to be put on hormone pills before due to concern for weight gain.  Denies having a recent ultrasound.          Past Medical History:  Diagnosis Date   Bronchitis    Pneumonia     Patient Active Problem List   Diagnosis Date Noted   Abnormal uterine bleeding 01/16/2021   Encounter for screening breast examination 09/08/2019   Breast pain, right 09/08/2019    Past Surgical History:  Procedure Laterality Date   CHOLECYSTECTOMY  2009   TONSILLECTOMY  2000    Prior to Admission medications   Medication Sig Start Date End Date Taking? Authorizing Provider  acetaminophen (TYLENOL) 500 MG tablet Take 500 mg by mouth every 6 (six) hours as needed.    [provider]  albuterol (VENTOLIN HFA) 108 (90 Base) MCG/ACT inhaler Inhale 2 puffs into the lungs every 4 (four) hours as needed for wheezing or shortness of breath. 07/11/20   Faustino Congress, NP    Allergies Aspirin, Penicillins, Sudafed [pseudoephedrine], Codeine, and Erythromycin  Family History  Problem Relation Age of Onset   Lung cancer Mother    Diabetes  Father    COPD Father    Hypertension Father    Heart attack Father    Breast cancer Paternal Aunt    Breast cancer Paternal Aunt    Breast cancer Maternal Great-grandmother     Social History Social History   Tobacco Use   Smoking status: Never   Smokeless tobacco: Never  Vaping Use   Vaping Use: Never used  Substance Use Topics   Alcohol use: Yes    Comment: occ   Drug use: Never      Review of Systems Constitutional: No fever/chills Eyes: No visual changes. ENT: No sore throat. Cardiovascular: Denies chest pain. Respiratory: Denies shortness of breath. Gastrointestinal: Abdominal cramping no nausea, no vomiting.  No diarrhea.  No constipation. Genitourinary: Negative for dysuria.  Vaginal bleeding Musculoskeletal: Negative for back pain. Skin: Negative for rash. Neurological: Negative for headaches, focal weakness or numbness. All other ROS negative ____________________________________________   PHYSICAL EXAM:  VITAL SIGNS: ED Triage Vitals  Enc Vitals Group     BP 05/14/21 0618 (!) 122/111     Pulse Rate 05/14/21 0618 (!) 116     Resp 05/14/21 0618 20     Temp 05/14/21 0618 97.8 F (36.6 C)     Temp Source 05/14/21 0618 Oral     SpO2 05/14/21 0618 97 %     Weight 05/14/21 0616 241 lb (109.3 kg)     Height 05/14/21 0616 '5\' 5"'$  (1.651 m)  Head Circumference --      Peak Flow --      Pain Score 05/14/21 0616 7     Pain Loc --      Pain Edu? --      Excl. in [place]? --     Constitutional: Alert and oriented. Well appearing and in no acute distress. Eyes: Conjunctivae are normal. EOMI. Head: Atraumatic. Nose: No congestion/rhinnorhea. Mouth/Throat: Mucous membranes are moist.   Neck: No stridor. Trachea Midline. FROM Cardiovascular: Tachycardic, regular rhythm. Grossly normal heart sounds.  Good peripheral circulation. Respiratory: Normal respiratory effort.  No retractions. Lungs CTAB. Gastrointestinal: Soft and nontender. No distention. No  abdominal bruits.  Musculoskeletal: No lower extremity tenderness nor edema.  No joint effusions. Neurologic:  Normal speech and language. No gross focal neurologic deficits are appreciated.  Skin:  Skin is warm, dry and intact. No rash noted. Psychiatric: Mood and affect are normal. Speech and behavior are normal. GU: Mild amount of blood in the vault.  ____________________________________________   LABS (all labs ordered are listed, but only abnormal results are displayed)  Labs Reviewed  CBC WITH DIFFERENTIAL/PLATELET  COMPREHENSIVE METABOLIC PANEL  PROTIME-INR  APTT  HCG, QUANTITATIVE, PREGNANCY  URINALYSIS, ROUTINE W REFLEX MICROSCOPIC  TYPE AND SCREEN   ____________________________________________   ED ECG REPORT I, Vanessa [place], the attending physician, personally viewed and interpreted this ECG.  Normal sinus rate 94, no ST elevation, no T wave versions, normal intervals ____________________________________________  RADIOLOGY Robert Bellow, personally viewed and evaluated these images (plain radiographs) as part of my medical decision making, as well as reviewing the written report by the radiologist.  ED MD interpretation:  pending   Official radiology report(s): No results found.  ____________________________________________   PROCEDURES  Procedure(s) performed (including Critical Care):  .1-3 Lead EKG Interpretation  Date/Time: 05/14/2021 6:50 AM Performed by: Vanessa [place], MD Authorized by: Vanessa [place], MD     Interpretation: abnormal     ECG rate:  100s   ECG rate assessment: tachycardic     Rhythm: sinus tachycardia     Ectopy: none     Conduction: normal   Comments:     Initially sinus tachycardia but coming down to sinus   ____________________________________________   INITIAL IMPRESSION / ASSESSMENT AND PLAN / ED COURSE  Dana Randolph was evaluated in Emergency Department on 05/14/2021 for the symptoms described in the history of  present illness. She was evaluated in the context of the global COVID-19 pandemic, which necessitated consideration that the patient might be at risk for infection with the SARS-CoV-2 virus that causes COVID-19. Institutional protocols and algorithms that pertain to the evaluation of patients at risk for COVID-19 are in a state of rapid change based on information released by regulatory bodies including the CDC and federal and state organizations. These policies and algorithms were followed during the patient's care in the ED.     Patient comes in with tachycardia vaginal bleeding.  Will get hCG to make sure there is no evidence of pregnancy.  Will get ultrasound to evaluate for fibroids, endometrial thickening or other causes of abnormal bleeding.  We will keep patient on the cardiac monitor secondary to the tachycardia given dose of IV fentanyl to help with pain.  Patient Exam does have a mild amount of blood in the bowl at this time.  She declined STD testing.  Patient be handed off to oncoming team pending ultrasound, labs, d/w OB and reevaluation.  ____________________________________________   FINAL CLINICAL IMPRESSION(S) / ED DIAGNOSES   Final diagnoses:  Vaginal bleeding      MEDICATIONS GIVEN DURING THIS VISIT:  Medications  fentaNYL (SUBLIMAZE) injection 50 mcg (has no administration in time range)  ondansetron (ZOFRAN) injection 4 mg (has no administration in time range)     ED Discharge Orders     None        Note:  This document was prepared using Dragon voice recognition software and may include unintentional dictation errors.    Vanessa [place], MD 05/14/21 [phone]

## 2021-05-14 NOTE — Discharge Instructions (Addendum)
I discussed your case with Dr. Marcelline Mates on-call for [facility].  She suggest we use Provera 10 mg 1 a day for 10 days and follow-up with her.  This should control the bleeding.  The fibroids that showed up on your ultrasound which are right under the lining of the uterus are probably what is causing the bleeding.  Please return here for increasing bleeding or lightheadedness or any other problems.  Currently your blood count is within normal limits.  You also have some white blood cells in the urine.  I have given you some fosfomycin here in the emergency room in case you are having UTI.  For the pain you can try some Motrin 800 mg or 4 of the over-the-counter pills 3 times a day for the pelvic pain.  That should help a good deal.  It generally does not cause heavier bleeding.

## 2021-05-14 NOTE — ED Triage Notes (Signed)
Patient ambulatory to triage with steady gait, without difficulty or distress noted; pt reports abd cramping & vag bleeding

## 2021-05-14 NOTE — ED Notes (Signed)
Pt transferred to US. 

## 2021-06-13 ENCOUNTER — Other Ambulatory Visit: Payer: Self-pay

## 2021-06-13 ENCOUNTER — Encounter: Payer: Self-pay | Admitting: Obstetrics and Gynecology

## 2021-06-13 ENCOUNTER — Ambulatory Visit (INDEPENDENT_AMBULATORY_CARE_PROVIDER_SITE_OTHER): Payer: Self-pay | Admitting: Obstetrics and Gynecology

## 2021-06-13 VITALS — BP 137/94 | HR 92 | Ht 65.0 in | Wt 257.5 lb

## 2021-06-13 DIAGNOSIS — N939 Abnormal uterine and vaginal bleeding, unspecified: Secondary | ICD-10-CM

## 2021-06-13 DIAGNOSIS — D251 Intramural leiomyoma of uterus: Secondary | ICD-10-CM

## 2021-06-13 DIAGNOSIS — D252 Subserosal leiomyoma of uterus: Secondary | ICD-10-CM

## 2021-06-13 DIAGNOSIS — D250 Submucous leiomyoma of uterus: Secondary | ICD-10-CM

## 2021-06-13 MED ORDER — NORETHINDRONE ACETATE 5 MG PO TABS
5.0000 mg | ORAL_TABLET | Freq: Every day | ORAL | 2 refills | Status: DC
Start: 2021-06-13 — End: 2021-08-19

## 2021-06-13 NOTE — Progress Notes (Signed)
GYNECOLOGY PROGRESS NOTE  Subjective:    Patient ID: Dana Randolph, female    DOB: 05-11-1971, 50 y.o.   MRN: [id number]  HPI  Patient is a 50 y.o. G7P4004 female who presents for heavy and prolonged bleeding for the past 6 to 8 months.  She was seen in May for her issues and at that time desired to proceed with surgical management with an endometrial ablation, however due to insurance issues she was not able to go through with the procedure.  She had previously tried an IUD which did not help her bleeding either.  She was recently seen in the emergency room approximately 1 month ago for worsening bleeding, and was discharged with Provera tablets.  She states that this slowed down the bleeding however the bleeding has never really stopped.   The following portions of the patient's history were reviewed and updated as appropriate: allergies, current medications, past family history, past medical history, past social history, past surgical history, and problem list.  Review of Systems Pertinent items noted in HPI and remainder of comprehensive ROS otherwise negative.   Objective:   Blood pressure (!) 137/94, pulse 92, height 5\' 5"  (1.651 m), weight 257 lb 8 oz (116.8 kg), last menstrual period 05/14/2021. Body mass index is 42.85 kg/m. General appearance: alert, cooperative, appears stated age, and no distress Remainder of exam deferred today per patient request notes that she does not have time to stay as she must pick her dog up who is having surgery.   Labs:  Lab Results  Component Value Date   WBC 9.3 05/14/2021   HGB 12.2 05/14/2021   HCT 36.6 05/14/2021   MCV 80.8 05/14/2021   PLT 280 05/14/2021   Lab Results  Component Value Date   TSH 0.882 01/24/2021    Imaging:  US PELVIC COMPLETE W TRANSVAGINAL AND TORSION R/O CLINICAL DATA:  Heavy vaginal bleeding.  EXAM: TRANSABDOMINAL AND TRANSVAGINAL ULTRASOUND OF PELVIS  DOPPLER ULTRASOUND OF OVARIES  TECHNIQUE: Both  transabdominal and transvaginal ultrasound examinations of the pelvis were performed. Transabdominal technique was performed for global imaging of the pelvis including uterus, ovaries, adnexal regions, and pelvic cul-de-sac.  It was necessary to proceed with endovaginal exam following the transabdominal exam to visualize the uterus, endometrium, and ovaries. Color and duplex Doppler ultrasound was utilized to evaluate blood flow to the ovaries.  COMPARISON:  None.  FINDINGS: Uterus  Measurements: 11.7 x 6.0 x 7.1 cm = volume: 263 mL. 1.2 cm intramural fibroid in the posterior body. 1.6 cm subserosal fibroid in the anterior fundus. 2.3 cm intramural fibroid in the posterior body.  Endometrium  Thickness: 12 mm.  No focal abnormality visualized.  Right ovary  Measurements: 1.7 x 1.7 x 1.5 cm = volume: 2.2 mL. Normal appearance/no adnexal mass.  Left ovary  Measurements: 2.3 x 1.4 x 1.7 cm = volume: 3 mL. Normal appearance/no adnexal mass.  Pulsed Doppler evaluation of both ovaries demonstrates normal low-resistance arterial and venous waveforms.  Other findings  No abnormal free fluid.  IMPRESSION: 1. No acute abnormality. 2. Several small uterine fibroids including a 1.2 cm submucosal fibroid in the posterior uterine body.  Electronically Signed   By: Titus Dubin M.D.   On: 05/14/2021 11:47  Assessment:   1. Abnormal uterine bleeding   2. Obesity, Class III, BMI 40-49.9 (morbid obesity) ([place])   3. Intramural, submucous, and subserous leiomyoma of uterus      Plan:    Patient still notes that she  ultimately desires surgical management with an endometrial ablation for management of her bleeding.  Discussed options to obtain financial assistance in order that she can and proceed with surgical intervention.  In the meantime, discussed alternative medication for use for her bleeding which was Aygestin that she can take daily.  Patient okay to try, will send  in prescription. Patient with risk factor of obesity, discussed need for endometrial biopsy prior to having any surgical intervention in order to rule out hyperplasia or malignancy.  Patient notes understanding, is unable to stay today for an exam and biopsy.  She will return in 1 to 2 weeks to have biopsy performed. Patient with several small fibroids located in the uterus.  Discussed that the only one concerning for issues with bleeding would be the submucosal fibroid.  If the fibroid is noticeably protruding into the endometrial cavity at the time of surgery can consider removal with myomectomy at the time of ablation.  A total of 20 minutes were spent face-to-face with the patient during this encounter and over half of that time dealt with counseling and coordination of care.   Rubie Maid, MD [facility]

## 2021-06-17 ENCOUNTER — Encounter: Payer: Self-pay | Admitting: Obstetrics and Gynecology

## 2021-06-17 NOTE — Patient Instructions (Signed)
Endometrial Biopsy An endometrial biopsy is a procedure to remove tissue samples from the endometrium, which is the lining of the uterus. The tissue that is removed can then be checked under a microscope for disease. This procedure is used to diagnose conditions such as endometrial cancer, endometrial tuberculosis, polyps, or other inflammatory conditions. This procedure may also be used to investigate uterine bleeding to determine where you are in your menstrual cycle or how your hormone levels are affecting the lining of the uterus. Tell a health care provider about: Any allergies you have. All medicines you are taking, including vitamins, herbs, eye drops, creams, and over-the-counter medicines. Any problems you or family members have had with anesthetic medicines. Any blood disorders you have. Any surgeries you have had. Any medical conditions you have. Whether you are pregnant or may be pregnant. What are the risks? Generally, this is a safe procedure. However, problems may occur, including: Bleeding. Pelvic infection. Puncture of the wall of the uterus with the biopsy device (rare). Allergic reactions to medicines. What happens before the procedure? Keep a record of your menstrual cycles as told by your health care provider. You may need to schedule your procedure for a specific time in your cycle. You may want to bring a sanitary pad to wear after the procedure. Plan to have someone take you home from the hospital or clinic. Ask your health care provider about: Changing or stopping your regular medicines. This is especially important if you are taking diabetes medicines, arthritis medicines, or blood thinners. Taking medicines such as aspirin and ibuprofen. These medicines can thin your blood. Do not take these medicines unless your health care provider tells you to take them. Taking over-the-counter medicines, vitamins, herbs, and supplements. What happens during the procedure? You  will lie on an exam table with your feet and legs supported as in a pelvic exam. Your health care provider will insert an instrument (speculum) into your vagina to see your cervix. Your cervix will be cleansed with an antiseptic solution. A medicine (local anesthetic) will be used to numb the cervix. A forceps instrument (tenaculum) will be used to hold your cervix steady for the biopsy. A thin, rod-like instrument (uterine sound) will be inserted through your cervix to determine the length of your uterus and the location where the biopsy sample will be removed. A thin, flexible tube (catheter) will be inserted through your cervix and into the uterus. The catheter will be used to collect the biopsy sample from your endometrial tissue. The catheter and speculum will then be removed, and the tissue sample will be sent to a lab for examination. The procedure may vary among health care providers and hospitals. What can I expect after procedure? You will rest in a recovery area until you are ready to go home. You may have mild cramping and a small amount of vaginal bleeding. This is normal. You may have a small amount of vaginal bleeding for a few days. This is normal. It is up to you to get the results of your procedure. Ask your health care provider, or the department that is doing the procedure, when your results will be ready. Follow these instructions at home: Take over-the-counter and prescription medicines only as told by your health care provider. Do not douche, use tampons, or have sexual intercourse until your health care provider approves. Return to your normal activities as told by your health care provider. Ask your health care provider what activities are safe for you. Follow instructions   from your health care provider about any activity restrictions, such as restrictions on strenuous exercise or heavy lifting. Keep all follow-up visits. This is important. Contact a health care  provider: You have heavy bleeding, or bleed for longer than 2 days after the procedure. You have bad smelling discharge from your vagina. You have a fever or chills. You have a burning sensation when urinating or you have difficulty urinating. You have severe pain in your lower abdomen. Get help right away if you: You have severe cramps in your stomach or back. You pass large blood clots. Your bleeding increases. You become weak or light-headed, or you faint or lose consciousness. Summary An endometrial biopsy is a procedure to remove tissue samples is taken from the endometrium, which is the lining of the uterus. The tissue sample that is removed will be checked under a microscope for disease. This procedure is used to diagnose conditions such as endometrial cancer, endometrial tuberculosis, polyps, or other inflammatory conditions. After the procedure, it is common to have mild cramping and a small amount of vaginal bleeding for a few days. Do not douche, use tampons, or have sexual intercourse until your health care provider approves. Ask your health care provider which activities are safe for you. This information is not intended to replace advice given to you by your health care provider. Make sure you discuss any questions you have with your health care provider. Document Revised: 03/19/2020 Document Reviewed: 03/19/2020 Elsevier Patient Education  2022 [employer]  

## 2021-06-20 NOTE — Progress Notes (Deleted)
    GYNECOLOGY PROGRESS NOTE  Subjective:    Patient ID: Dana Randolph, female    DOB: September 12, 1970, 50 y.o.   MRN: [id number]  HPI  Patient is a 50 y.o. G85P4004 female who presents for Endometrial Biopsy.  {Common ambulatory SmartLinks:19316}  Review of Systems {ros; complete:30496}   Objective:   There were no vitals taken for this visit. There is no height or weight on file to calculate BMI. General appearance: {general exam:16600} Abdomen: {abdominal exam:16834} Pelvic: {pelvic exam:16852::"cervix normal in appearance","external genitalia normal","no adnexal masses or tenderness","no cervical motion tenderness","rectovaginal septum normal","uterus normal size, shape, and consistency","vagina normal without discharge"} Extremities: {extremity exam:5109} Neurologic: {neuro exam:17854}   Assessment:   No diagnosis found.   Plan:   There are no diagnoses linked to this encounter.     Rubie Maid, MD [facility]

## 2021-06-21 ENCOUNTER — Encounter: Payer: Medicaid Other | Admitting: Obstetrics and Gynecology

## 2021-06-21 ENCOUNTER — Encounter: Payer: Self-pay | Admitting: Obstetrics and Gynecology

## 2021-07-11 ENCOUNTER — Other Ambulatory Visit (HOSPITAL_COMMUNITY)
Admission: RE | Admit: 2021-07-11 | Discharge: 2021-07-11 | Disposition: A | Discharge status: Home / Self Care | Payer: Medicaid Other | Source: Ambulatory Visit | Attending: Obstetrics and Gynecology | Admitting: Obstetrics and Gynecology

## 2021-07-11 ENCOUNTER — Ambulatory Visit (INDEPENDENT_AMBULATORY_CARE_PROVIDER_SITE_OTHER): Payer: Self-pay | Admitting: Obstetrics and Gynecology

## 2021-07-11 ENCOUNTER — Encounter: Payer: Self-pay | Admitting: Obstetrics and Gynecology

## 2021-07-11 ENCOUNTER — Other Ambulatory Visit: Payer: Self-pay

## 2021-07-11 VITALS — BP 117/81 | HR 99 | Ht 65.0 in | Wt 254.0 lb

## 2021-07-11 DIAGNOSIS — N939 Abnormal uterine and vaginal bleeding, unspecified: Secondary | ICD-10-CM | POA: Diagnosis not present

## 2021-07-11 DIAGNOSIS — D251 Intramural leiomyoma of uterus: Secondary | ICD-10-CM

## 2021-07-11 DIAGNOSIS — D250 Submucous leiomyoma of uterus: Secondary | ICD-10-CM

## 2021-07-11 DIAGNOSIS — D252 Subserosal leiomyoma of uterus: Secondary | ICD-10-CM

## 2021-07-11 NOTE — Patient Instructions (Signed)
Endometrial Biopsy  An endometrial biopsy is a procedure to remove tissue samples from the endometrium, which is the lining of the uterus. The tissue that is removed canthen be checked under a microscope for disease. This procedure is used to diagnose conditions such as endometrial cancer, endometrial tuberculosis, polyps, or other inflammatory conditions. This procedure may also be used to investigate uterine bleeding to determine where you are in your menstrual cycle or how your hormone levels are affecting thelining of the uterus. Tell a health care provider about: Any allergies you have. All medicines you are taking, including vitamins, herbs, eye drops, creams, and over-the-counter medicines. Any problems you or family members have had with anesthetic medicines. Any blood disorders you have. Any surgeries you have had. Any medical conditions you have. Whether you are pregnant or may be pregnant. What are the risks? Generally, this is a safe procedure. However, problems may occur, including: Bleeding. Pelvic infection. Puncture of the wall of the uterus with the biopsy device (rare). Allergic reactions to medicines. What happens before the procedure? Keep a record of your menstrual cycles as told by your health care provider. You may need to schedule your procedure for a specific time in your cycle. You may want to bring a sanitary pad to wear after the procedure. Plan to have someone take you home from the hospital or clinic. Ask your health care provider about: Changing or stopping your regular medicines. This is especially important if you are taking diabetes medicines, arthritis medicines, or blood thinners. Taking medicines such as aspirin and ibuprofen. These medicines can thin your blood. Do not take these medicines unless your health care provider tells you to take them. Taking over-the-counter medicines, vitamins, herbs, and supplements. What happens during the procedure? You  will lie on an exam table with your feet and legs supported as in a pelvic exam. Your health care provider will insert an instrument (speculum) into your vagina to see your cervix. Your cervix will be cleansed with an antiseptic solution. A medicine (local anesthetic) will be used to numb the cervix. A forceps instrument (tenaculum) will be used to hold your cervix steady for the biopsy. A thin, rod-like instrument (uterine sound) will be inserted through your cervix to determine the length of your uterus and the location where the biopsy sample will be removed. A thin, flexible tube (catheter) will be inserted through your cervix and into the uterus. The catheter will be used to collect the biopsy sample from your endometrial tissue. The catheter and speculum will then be removed, and the tissue sample will be sent to a lab for examination. The procedure may vary among health care providers and hospitals. What can I expect after procedure? You will rest in a recovery area until you are ready to go home. You may have mild cramping and a small amount of vaginal bleeding. This is normal. You may have a small amount of vaginal bleeding for a few days. This is normal. It is up to you to get the results of your procedure. Ask your health care provider, or the department that is doing the procedure, when your results will be ready. Follow these instructions at home: Take over-the-counter and prescription medicines only as told by your health care provider. Do not douche, use tampons, or have sexual intercourse until your health care provider approves. Return to your normal activities as told by your health care provider. Ask your health care provider what activities are safe for you. Follow instructions from   your health care provider about any activity restrictions, such as restrictions on strenuous exercise or heavy lifting. Keep all follow-up visits. This is important. Contact a health care  provider: You have heavy bleeding, or bleed for longer than 2 days after the procedure. You have bad smelling discharge from your vagina. You have a fever or chills. You have a burning sensation when urinating or you have difficulty urinating. You have severe pain in your lower abdomen. Get help right away if you: You have severe cramps in your stomach or back. You pass large blood clots. Your bleeding increases. You become weak or light-headed, or you faint or lose consciousness. Summary An endometrial biopsy is a procedure to remove tissue samples is taken from the endometrium, which is the lining of the uterus. The tissue sample that is removed will be checked under a microscope for disease. This procedure is used to diagnose conditions such as endometrial cancer, endometrial tuberculosis, polyps, or other inflammatory conditions. After the procedure, it is common to have mild cramping and a small amount of vaginal bleeding for a few days. Do not douche, use tampons, or have sexual intercourse until your health care provider approves. Ask your health care provider which activities are safe for you. This information is not intended to replace advice given to you by your health care provider. Make sure you discuss any questions you have with your healthcare provider. Document Revised: 03/19/2020 Document Reviewed: 03/19/2020 Elsevier Patient Education  2022 [employer]. ENDOMETRIAL BIOPSY POST-PROCEDURE INSTRUCTIONS  You may take Ibuprofen, Aleve or Tylenol for pain if needed.  Cramping should resolve within in 24 hours.  You may have a small amount of spotting.  You should wear a mini pad for the next few days.  You may have intercourse after 24 hours.  You need to call if you have any pelvic pain, fever, heavy bleeding or foul smelling vaginal discharge.  Shower or bathe as normal  6. We will call you within one week with results or we will discuss   the results at your follow-up  appointment if needed.   

## 2021-07-11 NOTE — Progress Notes (Signed)
    GYNECOLOGY PROGRESS NOTE  Subjective:    Patient ID: Dana Randolph, female    DOB: [birth year], 50 y.o.   MRN: [id number]  HPI  Patient is a 50 y.o. [id number] female who presents for endometrial biopsy for perimenopausal bleeding.  She is planning on having an endometrial ablation but is applying for financial assistance.   The following portions of the patient's history were reviewed and updated as appropriate: allergies, current medications, past family history, past medical history, past social history, past surgical history, and problem list.  Review of Systems Pertinent items noted in HPI and remainder of comprehensive ROS otherwise negative.   Objective:   Blood pressure 117/81, pulse 99, height 5\' 5"  (1.651 m), weight 254 lb (115.2 kg), last menstrual period 06/14/2021.  Body mass index is 42.27 kg/m. General appearance: alert and no distress Abdomen: soft, non-tender; bowel sounds normal; no masses,  no organomegaly Pelvic: external genitalia normal, rectovaginal septum normal.  Vagina with scant brown blood.  Cervix normal appearing, no lesions and no motion tenderness.  Uterus mobile, nontender, normal shape and size.  Adnexae non-palpable, nontender bilaterally.  Extremities: extremities normal, atraumatic, no cyanosis or edema Neurologic: Grossly normal   Assessment:   1. Abnormal uterine bleeding   2. Intramural, submucous, and subserous leiomyoma of uterus      Plan:   Endometrial biopsy performed today (see below).  Given post-procedure instructions.  Continue Aygestin. May increase dose to 10 mg if breakthrough bleeding occurs.  To schedule once patient has obtained financial assistance.    Endometrial Biopsy Procedure Note  The patient is positioned on the exam table in the dorsal lithotomy position. Bimanual exam confirms uterine position and size. A Graves speculum is placed into the vagina. A single toothed tenaculum is placed onto the anterior lip of the  cervix. The pipette is placed into the endocervical canal and is advanced to the uterine fundus. Using a piston like technique, with vacuum created by withdrawing the stylus, the endometrial specimen is obtained and transferred to the biopsy container. Minimal bleeding is encountered. The procedure is well tolerated.   Uterine Position:anterior    Uterine Length: 9 cm   Uterine Specimen: Average   Post procedure instructions are given. The patient is scheduled for follow up appointment.   Rubie Maid, MD [facility]

## 2021-07-12 LAB — SURGICAL PATHOLOGY

## 2021-07-26 ENCOUNTER — Encounter: Payer: Self-pay | Admitting: Obstetrics and Gynecology

## 2021-08-16 ENCOUNTER — Other Ambulatory Visit: Payer: Self-pay

## 2021-08-16 ENCOUNTER — Other Ambulatory Visit: Payer: Self-pay | Admitting: Obstetrics and Gynecology

## 2021-08-16 ENCOUNTER — Encounter
Admission: RE | Admit: 2021-08-16 | Discharge: 2021-08-16 | Disposition: A | Discharge status: Home / Self Care | Payer: Medicaid Other | Source: Ambulatory Visit | Attending: Obstetrics and Gynecology | Admitting: Obstetrics and Gynecology

## 2021-08-16 DIAGNOSIS — Z01812 Encounter for preprocedural laboratory examination: Secondary | ICD-10-CM

## 2021-08-16 DIAGNOSIS — N939 Abnormal uterine and vaginal bleeding, unspecified: Secondary | ICD-10-CM

## 2021-08-16 NOTE — Pre-Procedure Instructions (Signed)
Pre -admission testing interview and patient instructions discussed over the phone. Patient verbalized understanding.

## 2021-08-16 NOTE — Patient Instructions (Addendum)
Your procedure is scheduled on:08/19/2021  Report to the Registration Desk on the 1st floor of the [facility]. To find out your arrival time, please call [phone] between 1PM - 3PM on:08/16/2021   REMEMBER: Instructions that are not followed completely may result in serious medical risk, up to and including death; or upon the discretion of your surgeon and anesthesiologist your surgery may need to be rescheduled.  Do not eat food after midnight the night before surgery.  No gum chewing, lozengers or hard candies.  You may however, water up to 2 hours before you are scheduled to arrive for your surgery. Do not drink anything within 2 hours of your scheduled arrival time.   Tylenol if needed     One week prior to surgery: Stop Anti-inflammatories (NSAIDS) such as Advil, Aleve, Ibuprofen, Motrin, Naproxen, Naprosyn and Aspirin based products such as Excedrin, Goodys Powder, BC Powder. Stop ANY OVER THE COUNTER supplements until after surgery. You may however, continue to take Tylenol if needed for pain up until the day of surgery.  No Alcohol for 24 hours before or after surgery.  No Smoking including e-cigarettes for 24 hours prior to surgery.  No chewable tobacco products for at least 6 hours prior to surgery.  No nicotine patches on the day of surgery.    Do not use any "recreational" drugs for at least a week prior to your surgery.  Please be advised that the combination of cocaine and anesthesia may have negative outcomes, up to and including death. If you test positive for cocaine, your surgery will be cancelled.  On the morning of surgery brush your teeth with toothpaste and water, you may rinse your mouth with mouthwash if you wish. Do not swallow any toothpaste or mouthwash.  Use CHG Soap or wipes as directed on instruction sheet.  Do not wear jewelry, make-up, hairpins, clips or nail polish.  Do not wear lotions, powders, or perfumes.   Do not shave body from  the neck down 48 hours prior to surgery just in case you cut yourself which could leave a site for infection.  Also, freshly shaved skin may become irritated if using the CHG soap.  Contact lenses, hearing aids and dentures may not be worn into surgery.  Do not bring valuables to the hospital. [facility] is not responsible for any missing/lost belongings or valuables.   Notify your doctor if there is any change in your medical condition (cold, fever, infection).   Wear comfortable clothing (specific to your surgery type) to the hospital.  After surgery, you can help prevent lung complications by doing breathing exercises.  Take deep breaths and cough every 1-2 hours. Your doctor may order a device called an Incentive Spirometer to help you take deep breaths. If you are being admitted to the hospital overnight, leave your suitcase in the car. After surgery it may be brought to your room.  If you are being discharged the day of surgery, you will not be allowed to drive home. You will need a responsible adult (18 years or older) to drive you home and stay with you that night.   If you are taking public transportation, you will need to have a responsible adult (18 years or older) with you. Please confirm with your physician that it is acceptable to use public transportation.   Please call the [facility] at [phone] if you have any questions about these instructions.  Surgery Visitation Policy:  Patients undergoing a surgery  or procedure may have one family member or support person with them as long as that person is not COVID-19 positive or experiencing its symptoms.  That person may remain in the waiting area during the procedure and may rotate out with other people.  Inpatient Visitation:    Visiting hours are 7 a.m. to 8 p.m. Up to two visitors ages 16+ are allowed at one time in a patient room. The visitors may rotate out with other people during the day.  Visitors must check out when they leave, or other visitors will not be allowed. One designated support person may remain overnight. The visitor must pass COVID-19 screenings, use hand sanitizer when entering and exiting the patient's room and wear a mask at all times, including in the patient's room. Patients must also wear a mask when staff or their visitor are in the room. Masking is required regardless of vaccination status.

## 2021-08-18 MED ORDER — ORAL CARE MOUTH RINSE: 15.0000 mL | Freq: Once | OROMUCOSAL | Status: AC

## 2021-08-18 MED ORDER — ACETAMINOPHEN 500 MG PO TABS: 1000.0000 mg | ORAL_TABLET | ORAL | Status: AC

## 2021-08-18 MED ORDER — LACTATED RINGERS IV SOLN: INTRAVENOUS | Status: DC

## 2021-08-18 MED ORDER — FAMOTIDINE 20 MG PO TABS: 20.0000 mg | ORAL_TABLET | Freq: Once | ORAL | Status: AC

## 2021-08-18 MED ORDER — CHLORHEXIDINE GLUCONATE 0.12 % MT SOLN: 15.0000 mL | Freq: Once | OROMUCOSAL | Status: AC

## 2021-08-19 ENCOUNTER — Encounter: Payer: Self-pay | Admitting: Obstetrics and Gynecology

## 2021-08-19 ENCOUNTER — Ambulatory Visit
Admission: RE | Admit: 2021-08-19 | Discharge: 2021-08-19 | Disposition: A | Discharge status: Home / Self Care | Payer: Self-pay | Attending: Obstetrics and Gynecology | Admitting: Obstetrics and Gynecology

## 2021-08-19 ENCOUNTER — Ambulatory Visit: Payer: Self-pay | Admitting: Certified Registered Nurse Anesthetist

## 2021-08-19 ENCOUNTER — Encounter
Admission: RE | Disposition: A | Discharge status: Home / Self Care | Payer: Self-pay | Source: Home / Self Care | Attending: Obstetrics and Gynecology

## 2021-08-19 ENCOUNTER — Other Ambulatory Visit: Payer: Self-pay

## 2021-08-19 DIAGNOSIS — N840 Polyp of corpus uteri: Secondary | ICD-10-CM | POA: Insufficient documentation

## 2021-08-19 DIAGNOSIS — D250 Submucous leiomyoma of uterus: Secondary | ICD-10-CM | POA: Insufficient documentation

## 2021-08-19 DIAGNOSIS — D251 Intramural leiomyoma of uterus: Secondary | ICD-10-CM | POA: Insufficient documentation

## 2021-08-19 DIAGNOSIS — G4730 Sleep apnea, unspecified: Secondary | ICD-10-CM | POA: Insufficient documentation

## 2021-08-19 DIAGNOSIS — D259 Leiomyoma of uterus, unspecified: Secondary | ICD-10-CM

## 2021-08-19 DIAGNOSIS — N939 Abnormal uterine and vaginal bleeding, unspecified: Secondary | ICD-10-CM | POA: Insufficient documentation

## 2021-08-19 DIAGNOSIS — J45909 Unspecified asthma, uncomplicated: Secondary | ICD-10-CM | POA: Insufficient documentation

## 2021-08-19 DIAGNOSIS — Z01812 Encounter for preprocedural laboratory examination: Secondary | ICD-10-CM

## 2021-08-19 DIAGNOSIS — N924 Excessive bleeding in the premenopausal period: Secondary | ICD-10-CM

## 2021-08-19 HISTORY — PX: DILITATION & CURRETTAGE/HYSTROSCOPY WITH NOVASURE ABLATION: SHX5568

## 2021-08-19 LAB — CBC
HCT: 37.8 % (ref 36.0–46.0)
Hemoglobin: 12.1 g/dL (ref 12.0–15.0)
MCH: 25.5 pg — ABNORMAL LOW (ref 26.0–34.0)
MCHC: 32 g/dL (ref 30.0–36.0)
MCV: 79.6 fL — ABNORMAL LOW (ref 80.0–100.0)
Platelets: 285 10*3/uL (ref 150–400)
RBC: 4.75 MIL/uL (ref 3.87–5.11)
RDW: 13 % (ref 11.5–15.5)
WBC: 9.6 10*3/uL (ref 4.0–10.5)
nRBC: 0 % (ref 0.0–0.2)

## 2021-08-19 LAB — POCT PREGNANCY, URINE: Preg Test, Ur: NEGATIVE

## 2021-08-19 LAB — TYPE AND SCREEN
ABO/RH(D): A POS
Antibody Screen: NEGATIVE

## 2021-08-19 SURGERY — DILATATION & CURETTAGE/HYSTEROSCOPY WITH NOVASURE ABLATION
Anesthesia: General | Site: Uterus | Wound class: Clean Contaminated

## 2021-08-19 MED ORDER — 0.9 % SODIUM CHLORIDE (POUR BTL) OPTIME
TOPICAL | Status: DC | PRN
  Administered 2021-08-19: 3000 mL

## 2021-08-19 MED ORDER — LIDOCAINE HCL (PF) 2 % IJ SOLN
INTRAMUSCULAR | Status: AC
  Filled 2021-08-19: qty 5

## 2021-08-19 MED ORDER — PROPOFOL 10 MG/ML IV BOLUS
INTRAVENOUS | Status: AC
  Filled 2021-08-19: qty 20

## 2021-08-19 MED ORDER — ACETAMINOPHEN 500 MG PO TABS
ORAL_TABLET | ORAL | Status: AC
  Administered 2021-08-19: 1000 mg via ORAL
  Filled 2021-08-19: qty 2

## 2021-08-19 MED ORDER — PROMETHAZINE HCL 25 MG/ML IJ SOLN
INTRAMUSCULAR | Status: AC
  Administered 2021-08-19: 6.25 mg via INTRAVENOUS
  Filled 2021-08-19: qty 1

## 2021-08-19 MED ORDER — SEVOFLURANE IN SOLN
RESPIRATORY_TRACT | Status: AC
  Filled 2021-08-19: qty 250

## 2021-08-19 MED ORDER — IBUPROFEN 600 MG PO TABS: 600.0000 mg | ORAL_TABLET | Freq: Four times a day (QID) | ORAL | 1 refills | Status: DC | PRN

## 2021-08-19 MED ORDER — LIDOCAINE HCL (CARDIAC) PF 100 MG/5ML IV SOSY
PREFILLED_SYRINGE | INTRAVENOUS | Status: DC | PRN
  Administered 2021-08-19: 100 mg via INTRAVENOUS

## 2021-08-19 MED ORDER — ONDANSETRON HCL 4 MG/2ML IJ SOLN
INTRAMUSCULAR | Status: DC | PRN
  Administered 2021-08-19: 4 mg via INTRAVENOUS

## 2021-08-19 MED ORDER — MIDAZOLAM HCL 2 MG/2ML IJ SOLN
INTRAMUSCULAR | Status: DC | PRN
  Administered 2021-08-19: 2 mg via INTRAVENOUS

## 2021-08-19 MED ORDER — FENTANYL CITRATE (PF) 100 MCG/2ML IJ SOLN
INTRAMUSCULAR | Status: AC
  Filled 2021-08-19: qty 2

## 2021-08-19 MED ORDER — DEXAMETHASONE SODIUM PHOSPHATE 10 MG/ML IJ SOLN
INTRAMUSCULAR | Status: AC
  Filled 2021-08-19: qty 1

## 2021-08-19 MED ORDER — PROPOFOL 10 MG/ML IV BOLUS
INTRAVENOUS | Status: DC | PRN
  Administered 2021-08-19: 200 mg via INTRAVENOUS

## 2021-08-19 MED ORDER — FENTANYL CITRATE (PF) 100 MCG/2ML IJ SOLN
INTRAMUSCULAR | Status: DC | PRN
  Administered 2021-08-19 (×2): 50 ug via INTRAVENOUS

## 2021-08-19 MED ORDER — DEXAMETHASONE SODIUM PHOSPHATE 10 MG/ML IJ SOLN
INTRAMUSCULAR | Status: DC | PRN
  Administered 2021-08-19: 10 mg via INTRAVENOUS

## 2021-08-19 MED ORDER — APREPITANT 40 MG PO CAPS
ORAL_CAPSULE | ORAL | Status: AC
  Administered 2021-08-19: 40 mg
  Filled 2021-08-19: qty 1

## 2021-08-19 MED ORDER — FENTANYL CITRATE (PF) 100 MCG/2ML IJ SOLN: 25.0000 ug | INTRAMUSCULAR | Status: DC | PRN

## 2021-08-19 MED ORDER — SODIUM CHLORIDE FLUSH 0.9 % IV SOLN
INTRAVENOUS | Status: AC
  Filled 2021-08-19: qty 10

## 2021-08-19 MED ORDER — ONDANSETRON HCL 4 MG/2ML IJ SOLN
INTRAMUSCULAR | Status: AC
  Filled 2021-08-19: qty 2

## 2021-08-19 MED ORDER — MIDAZOLAM HCL 2 MG/2ML IJ SOLN
INTRAMUSCULAR | Status: AC
  Filled 2021-08-19: qty 2

## 2021-08-19 MED ORDER — PROMETHAZINE HCL 25 MG/ML IJ SOLN: 6.2500 mg | INTRAMUSCULAR | Status: DC | PRN

## 2021-08-19 MED ORDER — CHLORHEXIDINE GLUCONATE 0.12 % MT SOLN
OROMUCOSAL | Status: AC
  Administered 2021-08-19: 15 mL via OROMUCOSAL
  Filled 2021-08-19: qty 15

## 2021-08-19 MED ORDER — FAMOTIDINE 20 MG PO TABS
ORAL_TABLET | ORAL | Status: AC
  Administered 2021-08-19: 20 mg via ORAL
  Filled 2021-08-19: qty 1

## 2021-08-19 SURGICAL SUPPLY — 26 items
ABLATOR SURESOUND NOVASURE (ABLATOR) IMPLANT
BAG INFUSER PRESSURE 100CC (MISCELLANEOUS) ×1 IMPLANT
DEVICE MYOSURE LITE (MISCELLANEOUS) ×1 IMPLANT
DRSG TELFA 3X8 NADH (GAUZE/BANDAGES/DRESSINGS) ×2 IMPLANT
GAUZE 4X4 16PLY ~~LOC~~+RFID DBL (SPONGE) ×2 IMPLANT
GLOVE SURG ENC MOIS LTX SZ6.5 (GLOVE) ×2 IMPLANT
GLOVE SURG UNDER LTX SZ7 (GLOVE) ×2 IMPLANT
GOWN STRL REUS W/ TWL LRG LVL3 (GOWN DISPOSABLE) ×2 IMPLANT
GOWN STRL REUS W/TWL LRG LVL3 (GOWN DISPOSABLE) ×4
HANDPIECE ABLA MINERVA ENDO (MISCELLANEOUS) ×1 IMPLANT
INFUSOR MANOMETER BAG 3000ML (MISCELLANEOUS) ×1 IMPLANT
IV LACTATED RINGERS 1000ML (IV SOLUTION) ×2 IMPLANT
KIT TURNOVER CYSTO (KITS) ×2 IMPLANT
LABEL OR SOLS (LABEL) ×2 IMPLANT
MANIFOLD NEPTUNE II (INSTRUMENTS) ×2 IMPLANT
NS IRRIG 500ML POUR BTL (IV SOLUTION) ×2 IMPLANT
PACK DNC HYST (MISCELLANEOUS) ×2 IMPLANT
PAD DRESSING TELFA 3X8 NADH (GAUZE/BANDAGES/DRESSINGS) IMPLANT
PAD OB MATERNITY 4.3X12.25 (PERSONAL CARE ITEMS) ×2 IMPLANT
PAD PREP 24X41 OB/GYN DISP (PERSONAL CARE ITEMS) ×2 IMPLANT
SCRUB EXIDINE 4% CHG 4OZ (MISCELLANEOUS) ×2 IMPLANT
SEAL ROD LENS SCOPE MYOSURE (ABLATOR) ×2 IMPLANT
SET CYSTO W/LG BORE CLAMP LF (SET/KITS/TRAYS/PACK) IMPLANT
TOWEL OR 17X26 4PK STRL BLUE (TOWEL DISPOSABLE) ×2 IMPLANT
TUBING CONNECTING 10 (TUBING) ×1 IMPLANT
WATER STERILE IRR 500ML POUR (IV SOLUTION) ×2 IMPLANT

## 2021-08-19 NOTE — H&P (Signed)
GYNECOLOGY PREOPERATIVE HISTORY AND PHYSICAL   Subjective:  Dana Randolph is a 50 y.o. [phone] here for surgical management of abnormal perimenopausal bleeding. She has had heavy and prolonged bleeding for the past 8-9 months. Failed management with IUD, and Provera/Aygestin tablets. No significant preoperative concerns.  Proposed surgery: Hysteroscopy Dilation and Curettage with endometrial ablation ([facility])    Pertinent Gynecological History: Menses: flow is moderate, with minimal cramping, and irregular bleeding (prolonged) Bleeding: dysfunctional uterine bleeding Contraception: none (same sex relationship) Last mammogram: normal Date: 08/2019.  Last pap: normal Date: 08/22/2020   Past Medical History:  Diagnosis Date   Bronchitis    Pneumonia     Past Surgical History:  Procedure Laterality Date   CHOLECYSTECTOMY  2009   TONSILLECTOMY  2000    OB History  Gravida Para Term Preterm AB Living  4 4 4     4   SAB IAB Ectopic Multiple Live Births          4    # Outcome Date GA Lbr Len/2nd Weight Sex Delivery Anes PTL Lv  4 Term 03/18/08 [redacted]w[redacted]d  3685 g F Vag-Spont  N LIV  3 Term 06/08/00 [redacted]w[redacted]d  3175 g F Vag-Spont EPI N LIV  2 Term 01/30/97 [redacted]w[redacted]d  3317 g M Vag-Spont EPI N LIV  1 Term 06/19/95 [redacted]w[redacted]d  3204 g M Vag-Spont EPI N LIV    Family History  Problem Relation Age of Onset   Lung cancer Mother    Diabetes Father    COPD Father    Hypertension Father    Heart attack Father    Breast cancer Paternal Aunt    Breast cancer Paternal Aunt    Breast cancer Maternal Great-grandmother     Social History   Socioeconomic History   Marital status: Married    Spouse name: Not on file   Number of children: Not on file   Years of education: Not on file   Highest education level: Bachelor's degree (e.g., BA, AB, BS)  Occupational History   Not on file  Tobacco Use   Smoking status: Never   Smokeless tobacco: Never  Vaping Use   Vaping Use: Never used   Substance and Sexual Activity   Alcohol use: Yes    Comment: occ   Drug use: Never   Sexual activity: Not Currently    Birth control/protection: None  Other Topics Concern   Not on file  Social History Narrative   Lives at home with wife.   Social Determinants of Health   Financial Resource Strain: Not on file  Food Insecurity: Not on file  Transportation Needs: Not on file  Physical Activity: Not on file  Stress: Not on file  Social Connections: Not on file  Intimate Partner Violence: Not on file    No current facility-administered medications on file prior to encounter.   Current Outpatient Medications on File Prior to Encounter  Medication Sig Dispense Refill   acetaminophen (TYLENOL) 500 MG tablet Take 1,000 mg by mouth every 8 (eight) hours as needed for moderate pain.     albuterol (VENTOLIN HFA) 108 (90 Base) MCG/ACT inhaler Inhale 2 puffs into the lungs every 4 (four) hours as needed for wheezing or shortness of breath. 18 g 0   norethindrone (AYGESTIN) 5 MG tablet Take 1 tablet (5 mg total) by mouth daily. (Patient not taking: Reported on 08/12/2021) 30 tablet 2   Allergies  Allergen Reactions   Aspirin Anaphylaxis    Throat swelling  Penicillins Anaphylaxis    Throat swelling   Sudafed [Pseudoephedrine] Anaphylaxis    Throat swelling   Codeine Nausea And Vomiting   Erythromycin Nausea And Vomiting    Review of Systems Constitutional: No recent fever/chills/sweats Respiratory: No recent cough/bronchitis Cardiovascular: No chest pain Gastrointestinal: No recent nausea/vomiting/diarrhea Genitourinary: No UTI symptoms Hematologic/lymphatic:No history of coagulopathy or recent blood thinner use    Objective:   Blood pressure (!) 141/102, pulse (!) 103, temperature 97.8 F (36.6 C), temperature source Temporal, resp. rate 16, height 5\' 5"  (1.651 m), weight 111.1 kg, last menstrual period 07/25/2021, SpO2 99 %. CONSTITUTIONAL: Well-developed, well-nourished  female in no acute distress.  HENT:  Normocephalic, atraumatic, External right and left ear normal. Oropharynx is clear and moist EYES: Conjunctivae and EOM are normal. Pupils are equal, round, and reactive to light. No scleral icterus.  NECK: Normal range of motion, supple, no masses SKIN: Skin is warm and dry. No rash noted. Not diaphoretic. No erythema. No pallor. NEUROLOGIC: Alert and oriented to person, place, and time. Normal reflexes, muscle tone coordination. No cranial nerve deficit noted. PSYCHIATRIC: Normal mood and affect. Normal behavior. Normal judgment and thought content. CARDIOVASCULAR: Normal heart rate noted, regular rhythm RESPIRATORY: Effort and breath sounds normal, no problems with respiration noted ABDOMEN: Soft, nontender, nondistended. PELVIC: Deferred MUSCULOSKELETAL: Normal range of motion. No edema and no tenderness. 2+ distal pulses.    Labs: Results for orders placed or performed during the hospital encounter of 08/19/21 (from the past 336 hour(s))  Pregnancy, urine POC   Collection Time: 08/19/21 10:55 AM  Result Value Ref Range   Preg Test, Ur NEGATIVE NEGATIVE  CBC   Collection Time: 08/19/21 11:17 AM  Result Value Ref Range   WBC 9.6 4.0 - 10.5 K/uL   RBC 4.75 3.87 - 5.11 MIL/uL   Hemoglobin 12.1 12.0 - 15.0 g/dL   HCT 37.8 36.0 - 46.0 %   MCV 79.6 (L) 80.0 - 100.0 fL   MCH 25.5 (L) 26.0 - 34.0 pg   MCHC 32.0 30.0 - 36.0 g/dL   RDW 13.0 11.5 - 15.5 %   Platelets 285 150 - 400 K/uL   nRBC 0.0 0.0 - 0.2 %     Imaging Studies: US PELVIC COMPLETE W TRANSVAGINAL AND TORSION R/O CLINICAL DATA:  Heavy vaginal bleeding.  EXAM: TRANSABDOMINAL AND TRANSVAGINAL ULTRASOUND OF PELVIS  DOPPLER ULTRASOUND OF OVARIES  TECHNIQUE: Both transabdominal and transvaginal ultrasound examinations of the pelvis were performed. Transabdominal technique was performed for global imaging of the pelvis including uterus, ovaries, adnexal regions, and pelvic  cul-de-sac.  It was necessary to proceed with endovaginal exam following the transabdominal exam to visualize the uterus, endometrium, and ovaries. Color and duplex Doppler ultrasound was utilized to evaluate blood flow to the ovaries.  COMPARISON:  None.  FINDINGS: Uterus  Measurements: 11.7 x 6.0 x 7.1 cm = volume: 263 mL. 1.2 cm intramural fibroid in the posterior body. 1.6 cm subserosal fibroid in the anterior fundus. 2.3 cm intramural fibroid in the posterior body.  Endometrium  Thickness: 12 mm.  No focal abnormality visualized.  Right ovary  Measurements: 1.7 x 1.7 x 1.5 cm = volume: 2.2 mL. Normal appearance/no adnexal mass.  Left ovary  Measurements: 2.3 x 1.4 x 1.7 cm = volume: 3 mL. Normal appearance/no adnexal mass.  Pulsed Doppler evaluation of both ovaries demonstrates normal low-resistance arterial and venous waveforms.  Other findings  No abnormal free fluid.  IMPRESSION: 1. No acute abnormality. 2. Several small uterine  fibroids including a 1.2 cm submucosal fibroid in the posterior uterine body.  Electronically Signed   By: Titus Dubin M.D.   On: 05/14/2021 11:47   Assessment:    Abnormal perimenopausal bleeding Uterine fibroids   Plan:   - Counseling: Procedure, risks, reasons, benefits and complications (including injury to bowel, bladder, major blood vessel, ureter, bleeding, possibility of transfusion, infection, or fistula formation) reviewed in detail. Likelihood of success in alleviating the patient's condition was discussed. Routine postoperative instructions will be reviewed with the patient and her family in detail after surgery.  The patient concurred with the proposed plan, giving informed written consent for the surgery.   - Preop testing ordered. - Patient has been NPO since midnight.    Rubie Maid, MD [facility]

## 2021-08-19 NOTE — Op Note (Signed)
Procedure(s): DILATATION & CURETTAGE/HYSTEROSCOPY WITH POLYPECTOMY AND MINERVA ABLATION Procedure Note  Dana Randolph female 50 y.o. 08/19/2021  Indications: The patient is a 50 y.o. G25P4004 female with abnormal uterine bleeding, uterine fibroid (submucosal and intramural)  Pre-operative Diagnosis: abnormal uterine bleeding, uterine fibroid  Post-operative Diagnosis: Same, with endometrial polyp  Surgeon: Rubie Maid, MD  Assistants:  None. An experienced assistant was required given the standard of surgical care given the complexity of the case.  This assistant was needed for exposure, dissection, suctioning, retraction, instrument exchange, and for overall help during the procedure.  Anesthesia: General endotracheal anesthesia  Procedure Details: The patient was seen in the Holding Room. The risks, benefits, complications, treatment options, and expected outcomes were discussed with the patient.  The patient concurred with the proposed plan, giving informed consent.  The site of surgery properly noted/marked. The patient was taken to the Operating Room, identified as Dana Randolph and the procedure verified as Procedure(s) (LRB): DILATATION & CURETTAGE/HYSTEROSCOPY AND MINERVA ABLATION (N/A). A Time Out was held and the above information confirmed.  The patient was then placed under general anesthesia without difficulty.  She was then prepped and draped in the normal sterile fashion, and placed in the dorsal lithotomy position.  A time out was performed.  An exam under anesthesia was performed with the findings noted above.  Straight catheterization was performed. A sterile speculum was inserted into vagina. A single-tooth tenaculum was used to grasp the anterior lip of the cervix. Cervical dilation was performed. A 5 mm hysteroscope was introduced into the uterus under direct visualization. The cavity was allowed to fill, and then the entire cavity was explored with the findings  described above. The Myosure device was then use to perform a polypectomy, per manufacturer instructions.  Next, the hysteroscope was removed, and a sharp curette was then passed into the uterus and endometrial sampling was collected for pathology. Due to the copious amount of moderately proliferative tissue, there was a concern for possibility of occult malignancy.  The endometrial curettings were sent to Pathology as frozen specimen.  Report returned benign proliferative tissue with progesterone effect.   The procedure was then continued as the Minerva instrument was primed per instructions. The instrument was then placed into the endometrial canal and activated.  The Minerva instrument was then removed from the uterine cavity.  The hysteroscope was then re-introduced for final survey, with adequate charring of the endometrium noted.  The hysteroscope was removed from the patient's uterine cavity. The tenaculum was removed and excellent hemostasis was noted. The speculum was removed from the vagina.   All instrument and sponge counts were correct at the end of the procedure x 2.  The patient tolerated the procedure well.  She was awakened and taken to the PACU in stable condition.   Findings: Uterus sounded to 12 cm.  Cervical length 4 cm.  Proliferative endometrium.  Tubal ostia were visualized bilaterally.  Endometrial polyp noted at left fundal region near left ostium.  Adequate charring of endometrial tissue post ablation.  No perforations noted.   Estimated Blood Loss:  10 ml      Drains: straight catheterization with 50 cc at start of procedure.          Total IV Fluids:  800 ml  Specimens:  Endometrial curettings (frozen specimen benign endometrial tissue with progesterone effect), endometrial polyp         Implants: None         Complications:  None; patient tolerated the  procedure well.         Disposition: PACU - hemodynamically stable.         Condition: stable   Rubie Maid, MD [facility]    Rubie Maid, MD [facility]

## 2021-08-19 NOTE — Transfer of Care (Signed)
Immediate Anesthesia Transfer of Care Note  Patient: Dana Randolph  Procedure(s) Performed: DILATATION & CURETTAGE/HYSTEROSCOPY WITH POLYPECTOMY AND MINERVA ABLATION (Uterus)  Patient Location: PACU  Anesthesia Type:General  Level of Consciousness: awake, alert  and oriented  Airway & Oxygen Therapy: Patient Spontanous Breathing and Patient connected to face mask oxygen  Post-op Assessment: Report given to RN and Post -op Vital signs reviewed and stable  Post vital signs: Reviewed  Last Vitals:  Vitals Value Taken Time  BP    Temp    Pulse    Resp    SpO2      Last Pain:  Vitals:   08/19/21 1113  TempSrc: Temporal  PainSc: 0-No pain         Complications: No notable events documented.

## 2021-08-19 NOTE — Anesthesia Preprocedure Evaluation (Signed)
Anesthesia Evaluation  Patient identified by MRN, date of birth, ID band Patient awake    Reviewed: Allergy & Precautions, NPO status , Patient's Chart, lab work & pertinent test results  History of Anesthesia Complications (+) PONV and history of anesthetic complications  Airway Mallampati: III  TM Distance: <3 FB Neck ROM: full    Dental  (+) Chipped   Pulmonary neg shortness of breath, asthma , sleep apnea ,    Pulmonary exam normal        Cardiovascular Exercise Tolerance: Good (-) angina(-) Past MI and (-) DOE + dysrhythmias Supra Ventricular Tachycardia      Neuro/Psych negative neurological ROS  negative psych ROS   GI/Hepatic negative GI ROS, Neg liver ROS, neg GERD  ,  Endo/Other  negative endocrine ROS  Renal/GU      Musculoskeletal   Abdominal   Peds  Hematology negative hematology ROS (+)   Anesthesia Other Findings Past Medical History: No date: Bronchitis No date: Pneumonia  Past Surgical History: 2009: CHOLECYSTECTOMY 2000: TONSILLECTOMY  BMI    Body Mass Index: 40.77 kg/m      Reproductive/Obstetrics negative OB ROS                             Anesthesia Physical Anesthesia Plan  ASA: 3  Anesthesia Plan: General LMA   Post-op Pain Management:    Induction: Intravenous  PONV Risk Score and Plan: Dexamethasone, Ondansetron, Midazolam, Treatment may vary due to age or medical condition and Aprepitant  Airway Management Planned: LMA  Additional Equipment:   Intra-op Plan:   Post-operative Plan: Extubation in OR  Informed Consent: I have reviewed the patients History and Physical, chart, labs and discussed the procedure including the risks, benefits and alternatives for the proposed anesthesia with the patient or authorized representative who has indicated his/her understanding and acceptance.     Dental Advisory Given  Plan Discussed with:  Anesthesiologist, CRNA and Surgeon  Anesthesia Plan Comments: (Patient consented for risks of anesthesia including but not limited to:  - adverse reactions to medications - damage to eyes, teeth, lips or other oral mucosa - nerve damage due to positioning  - sore throat or hoarseness - Damage to heart, brain, nerves, lungs, other parts of body or loss of life  Patient voiced understanding.)        Anesthesia Quick Evaluation

## 2021-08-19 NOTE — Anesthesia Procedure Notes (Signed)
Procedure Name: LMA Insertion Date/Time: 08/19/2021 12:15 PM Performed by: Johnna Acosta, CRNA Pre-anesthesia Checklist: Patient identified, Emergency Drugs available, Suction available, Patient being monitored and Timeout performed Patient Re-evaluated:Patient Re-evaluated prior to induction Oxygen Delivery Method: Circle system utilized Preoxygenation: Pre-oxygenation with 100% oxygen Induction Type: IV induction LMA: LMA inserted LMA Size: 4.5 Tube type: Oral Number of attempts: 1 Placement Confirmation: positive ETCO2 Tube secured with: Tape Dental Injury: Teeth and Oropharynx as per pre-operative assessment

## 2021-08-19 NOTE — Discharge Instructions (Signed)
AMBULATORY SURGERY  ?DISCHARGE INSTRUCTIONS ? ? ?The drugs that you were given will stay in your system until tomorrow so for the next 24 hours you should not: ? ?Drive an automobile ?Make any legal decisions ?Drink any alcoholic beverage ? ? ?You may resume regular meals tomorrow.  Today it is better to start with liquids and gradually work up to solid foods. ? ?You may eat anything you prefer, but it is better to start with liquids, then soup and crackers, and gradually work up to solid foods. ? ? ?Please notify your doctor immediately if you have any unusual bleeding, trouble breathing, redness and pain at the surgery site, drainage, fever, or pain not relieved by medication. ? ? ? ?Additional Instructions: ? ? ? ?Please contact your physician with any problems or Same Day Surgery at [phone], Monday through Friday 6 am to 4 pm, or [place] [facility] [place] [facility] number at [phone].  ?

## 2021-08-19 NOTE — Anesthesia Postprocedure Evaluation (Signed)
Anesthesia Post Note  Patient: Dana Randolph  Procedure(s) Performed: DILATATION & CURETTAGE/HYSTEROSCOPY WITH POLYPECTOMY AND MINERVA ABLATION (Uterus)  Patient location during evaluation: PACU Anesthesia Type: General Level of consciousness: awake and alert Pain management: pain level controlled Vital Signs Assessment: post-procedure vital signs reviewed and stable Respiratory status: spontaneous breathing, nonlabored ventilation, respiratory function stable and patient connected to nasal cannula oxygen Cardiovascular status: blood pressure returned to baseline and stable Postop Assessment: no apparent nausea or vomiting Anesthetic complications: no   No notable events documented.   Last Vitals:  Vitals:   08/19/21 1430 08/19/21 1445  BP: 127/89 122/82  Pulse: 71 75  Resp: 17 17  Temp:  36.4 C  SpO2: 97% 96%    Last Pain:  Vitals:   08/19/21 1445  TempSrc:   PainSc: 0-No pain                 Precious Haws Jillyan Plitt

## 2021-08-20 ENCOUNTER — Encounter: Payer: Self-pay | Admitting: Obstetrics and Gynecology

## 2021-08-20 LAB — SURGICAL PATHOLOGY

## 2021-08-28 ENCOUNTER — Encounter: Payer: Self-pay | Admitting: Obstetrics and Gynecology

## 2021-08-28 ENCOUNTER — Other Ambulatory Visit: Payer: Self-pay

## 2021-08-28 ENCOUNTER — Ambulatory Visit (INDEPENDENT_AMBULATORY_CARE_PROVIDER_SITE_OTHER): Payer: Self-pay | Admitting: Obstetrics and Gynecology

## 2021-08-28 VITALS — BP 131/91 | HR 90 | Resp 16 | Ht 65.0 in | Wt 261.6 lb

## 2021-08-28 DIAGNOSIS — Z1231 Encounter for screening mammogram for malignant neoplasm of breast: Secondary | ICD-10-CM

## 2021-08-28 DIAGNOSIS — Z4889 Encounter for other specified surgical aftercare: Secondary | ICD-10-CM

## 2021-08-28 DIAGNOSIS — Z9889 Other specified postprocedural states: Secondary | ICD-10-CM | POA: Insufficient documentation

## 2021-08-28 DIAGNOSIS — Z1239 Encounter for other screening for malignant neoplasm of breast: Secondary | ICD-10-CM

## 2021-08-28 DIAGNOSIS — Z8742 Personal history of other diseases of the female genital tract: Secondary | ICD-10-CM | POA: Insufficient documentation

## 2021-08-28 NOTE — Progress Notes (Signed)
° ° °  OBSTETRICS/GYNECOLOGY POST-OPERATIVE CLINIC VISIT  Subjective:     Dana Randolph is a 50 y.o. female who presents to the clinic 10 day status post  Dilation and Curettage/Hysteroscopy with Polypectomy and Minerva Ablation  for abnormal uterine bleeding and endometrial polyp . Eating a regular diet without difficulty. Bowel movements are normal. Pain is controlled without any medications.  The following portions of the patient's history were reviewed and updated as appropriate: allergies, current medications, past family history, past medical history, past social history, past surgical history, and problem list.  Review of Systems Pertinent items noted in HPI and remainder of comprehensive ROS otherwise negative.   Objective:   BP (!) 131/91    Pulse 90    Resp 16    Ht 5\' 5"  (1.651 m)    Wt 261 lb 9.6 oz (118.7 kg)    LMP 07/13/2021 (Approximate)    BMI 43.53 kg/m    General:  alert and no distress  Abdomen: soft, bowel sounds active, non-tender  Incision:   N/A    Pathology:  DIAGNOSIS:  A. ENDOMETRIUM; CURETTAGE:  - BENIGN SECRETORY ENDOMETRIUM WITH EXOGENOUS HORMONE EFFECTS.  - NEGATIVE FOR ATYPIA / EIN AND MALIGNANCY.   B. ENDOMETRIUM; CURETTAGE:  - BENIGN SECRETORY ENDOMETRIUM WITH EXOGENOUS HORMONE EFFECTS.  - NEGATIVE FOR ATYPIA / EIN AND MALIGNANCY.   C. ENDOMETRIAL POLYPS; CURETTAGE:  - POLYPOID BENIGN SECRETORY ENDOMETRIUM WITH EXOGENOUS HORMONE EFFECTS.  - NEGATIVE FOR ATYPIA / EIN AND MALIGNANCY.   Assessment:   Patient s/p Dilation and Curettage/Hysteroscopy with Polypectomy and Minerva Ablation (surgery)  Doing well postoperatively. Breast cancer screening.    Plan:   1. Continue any current medications as instructed by provider. 2. Wound care discussed. 3. Operative findings again reviewed. Pathology report discussed. 4. Activity restrictions:  pelvic rest x 1 additional week.  5. Anticipated return to work:  patient has been working from home and  did 1 day in office this week . 6. Reviewed chart, patient is overdue for mammogram. Will schedule.  7. Follow up: Return to clinic for any scheduled appointments or for any gynecologic concerns as needed.    Rubie Maid, MD [facility]

## 2021-10-22 ENCOUNTER — Encounter: Payer: Self-pay | Admitting: Obstetrics and Gynecology

## 2021-11-11 NOTE — Progress Notes (Deleted)
? ? ?  GYNECOLOGY PROGRESS NOTE ? ?Subjective:  ? ? Patient ID: Dana Randolph, female    DOB: 08-May-1971, 51 y.o.   MRN: [id number] ? ?HPI ? Patient is a 51 y.o. G50P4004 female who presents for ablation concerns. ? ?{Common ambulatory SmartLinks:19316} ? ?Review of Systems ?{ros; complete:30496}  ? ?Objective:  ? There were no vitals taken for this visit. There is no height or weight on file to calculate BMI. ?General appearance: {general exam:16600} ?Abdomen: {abdominal exam:16834} ?Pelvic: {pelvic exam:16852::"cervix normal in appearance","external genitalia normal","no adnexal masses or tenderness","no cervical motion tenderness","rectovaginal septum normal","uterus normal size, shape, and consistency","vagina normal without discharge"} ?Extremities: {extremity exam:5109} ?Neurologic: {neuro exam:17854} ? ? ?Assessment:  ? ?No diagnosis found.  ? ?Plan:  ? ?There are no diagnoses linked to this encounter.  ? ? ? ?Rubie Maid, MD ?[facility] ? ?

## 2021-11-12 ENCOUNTER — Encounter: Payer: Medicaid Other | Admitting: Obstetrics and Gynecology

## 2021-12-04 ENCOUNTER — Ambulatory Visit
Admission: RE | Admit: 2021-12-04 | Discharge: 2021-12-04 | Payer: Self-pay | Source: Ambulatory Visit | Attending: Emergency Medicine | Admitting: Emergency Medicine

## 2021-12-04 ENCOUNTER — Emergency Department: Payer: Medicaid Other

## 2021-12-04 ENCOUNTER — Ambulatory Visit (INDEPENDENT_AMBULATORY_CARE_PROVIDER_SITE_OTHER): Payer: Self-pay

## 2021-12-04 ENCOUNTER — Other Ambulatory Visit: Payer: Self-pay

## 2021-12-04 ENCOUNTER — Emergency Department
Admission: EM | Admit: 2021-12-04 | Discharge: 2021-12-04 | Disposition: A | Discharge status: Home / Self Care | Payer: Medicaid Other | Attending: Emergency Medicine | Admitting: Emergency Medicine

## 2021-12-04 VITALS — BP 156/81 | HR 107 | Temp 98.6°F | Resp 20

## 2021-12-04 DIAGNOSIS — M7989 Other specified soft tissue disorders: Secondary | ICD-10-CM | POA: Insufficient documentation

## 2021-12-04 DIAGNOSIS — R079 Chest pain, unspecified: Secondary | ICD-10-CM | POA: Insufficient documentation

## 2021-12-04 DIAGNOSIS — R0602 Shortness of breath: Secondary | ICD-10-CM

## 2021-12-04 DIAGNOSIS — R059 Cough, unspecified: Secondary | ICD-10-CM | POA: Insufficient documentation

## 2021-12-04 DIAGNOSIS — Z5321 Procedure and treatment not carried out due to patient leaving prior to being seen by health care provider: Secondary | ICD-10-CM | POA: Insufficient documentation

## 2021-12-04 DIAGNOSIS — M25473 Effusion, unspecified ankle: Secondary | ICD-10-CM

## 2021-12-04 LAB — CBC
HCT: 40.8 % (ref 36.0–46.0)
Hemoglobin: 12.8 g/dL (ref 12.0–15.0)
MCH: 25.8 pg — ABNORMAL LOW (ref 26.0–34.0)
MCHC: 31.4 g/dL (ref 30.0–36.0)
MCV: 82.3 fL (ref 80.0–100.0)
Platelets: 343 10*3/uL (ref 150–400)
RBC: 4.96 MIL/uL (ref 3.87–5.11)
RDW: 13.2 % (ref 11.5–15.5)
WBC: 12 10*3/uL — ABNORMAL HIGH (ref 4.0–10.5)
nRBC: 0 % (ref 0.0–0.2)

## 2021-12-04 LAB — BASIC METABOLIC PANEL
Anion gap: 7 (ref 5–15)
BUN: 14 mg/dL (ref 6–20)
CO2: 27 mmol/L (ref 22–32)
Calcium: 8.8 mg/dL — ABNORMAL LOW (ref 8.9–10.3)
Chloride: 103 mmol/L (ref 98–111)
Creatinine, Ser: 0.64 mg/dL (ref 0.44–1.00)
GFR, Estimated: >60 mL/min (ref >60)
Glucose, Bld: 192 mg/dL — ABNORMAL HIGH (ref 70–99)
Potassium: 3.8 mmol/L (ref 3.5–5.1)
Sodium: 137 mmol/L (ref 135–145)

## 2021-12-04 LAB — TROPONIN I (HIGH SENSITIVITY): Troponin I (High Sensitivity): 3 ng/L (ref <18)

## 2021-12-04 LAB — BRAIN NATRIURETIC PEPTIDE: B Natriuretic Peptide: 20.2 pg/mL (ref 0.0–100.0)

## 2021-12-04 NOTE — ED Notes (Signed)
Pt had chest xray done at [facility] and did not want to repeat it ?

## 2021-12-04 NOTE — ED Notes (Signed)
This nurse called pt name and waited 30 ,mins for patient to arrive in room. Pt did not show back up in room. MD notified. Assumed pt eloped ?

## 2021-12-04 NOTE — ED Provider Notes (Signed)
?  Clinical Course as of 12/04/21 1941  ?Wed Dec 04, 2021  ?1940 I go to evaluate the patient, but she has apparently eloped.  Blood pressure cuff in the room and she is not present.  I called her documented cell phone, the rings a few times and goes to voicemail. [DS]  ?1940 When patient was roomed, the triage nurse told me that patient was unwilling to wait at that point and was threatening to leave, but they convinced her to come straight back to her room [DS]  ?  ?Clinical Course User Index ?[DS] Vladimir Crofts, MD  ? ? ? ?  ?Vladimir Crofts, MD ?12/04/21 1941 ? ?

## 2021-12-04 NOTE — Discharge Instructions (Addendum)
Go to the emergency department for evaluation of your shortness of breath, chest pain, ankle edema. ?

## 2021-12-04 NOTE — ED Triage Notes (Signed)
Pt comes pov with chest pain for several weeks. Has a cough. Worse when laying down. Lower leg swelling that is new for pt.  ?

## 2021-12-04 NOTE — ED Triage Notes (Signed)
Pt presents with cough,SOB, chest congestion and fatigue x 1 week  ?

## 2021-12-04 NOTE — ED Provider Notes (Signed)
?[facility] ? ? ? ?CSN: [id number] ?Arrival date & time: 12/04/21  1603 ? ? ?  ? ?History   ?Chief Complaint ?Chief Complaint  ?Patient presents with  ? Cough  ?  I have been sick with a chest cold for 7 days using OTC meds. My chest now feels heavy and difficulty taking deep breaths - Entered by patient  ? Shortness of Breath  ? ? ?HPI ?Dana Randolph is a 51 y.o. female.  Patient presents with nonproductive cough and new onset ankle edema x1 month.  She also reports intermittent chest pain x2 to 3 days.  She describes the chest pain as heaviness and pressure which is worse with exertion and improves with rest.  The ankle edema is worse at the end of the day and improves with elevation.  She denies focal weakness, numbness, fever, chills, or other symptoms.  She reports remote history of pneumonia many years ago.  She does not have a PCP at this time.  ? ?The history is provided by the patient and medical records.  ? ?Past Medical History:  ?Diagnosis Date  ? Bronchitis   ? Pneumonia   ? ? ?Patient Active Problem List  ? Diagnosis Date Noted  ? S/P endometrial ablation 08/28/2021  ? H/O cervical polypectomy 08/28/2021  ? Abnormal uterine bleeding 01/16/2021  ? Encounter for screening breast examination 09/08/2019  ? Breast pain, right 09/08/2019  ? ? ?Past Surgical History:  ?Procedure Laterality Date  ? CHOLECYSTECTOMY  2009  ? DILITATION & CURRETTAGE/HYSTROSCOPY WITH NOVASURE ABLATION N/A 08/19/2021  ? Procedure: DILATATION & CURETTAGE/HYSTEROSCOPY WITH POLYPECTOMY AND MINERVA ABLATION;  Surgeon: Rubie Maid, MD;  Location: [facility] ORS;  Service: Gynecology;  Laterality: N/A;  ? TONSILLECTOMY  2000  ? ? ?OB History   ? ? Gravida  ?4  ? Para  ?4  ? Term  ?4  ? Preterm  ?   ? AB  ?   ? Living  ?4  ?  ? ? SAB  ?   ? IAB  ?   ? Ectopic  ?   ? Multiple  ?   ? Live Births  ?4  ?   ?  ?  ? ? ? ?Home Medications   ? ?Prior to Admission medications   ?Medication Sig Start Date End Date Taking? Authorizing Provider   ?acetaminophen (TYLENOL) 500 MG tablet Take 1,000 mg by mouth every 8 (eight) hours as needed for moderate pain.    [provider]  ?albuterol (VENTOLIN HFA) 108 (90 Base) MCG/ACT inhaler Inhale 2 puffs into the lungs every 4 (four) hours as needed for wheezing or shortness of breath. 07/11/20   Faustino Congress, NP  ? ? ?Family History ?Family History  ?Problem Relation Age of Onset  ? Lung cancer Mother   ? Diabetes Father   ? COPD Father   ? Hypertension Father   ? Heart attack Father   ? Breast cancer Paternal Aunt   ? Breast cancer Paternal Aunt   ? Breast cancer Maternal Great-grandmother   ? ? ?Social History ?Social History  ? ?Tobacco Use  ? Smoking status: Never  ? Smokeless tobacco: Never  ?Vaping Use  ? Vaping Use: Never used  ?Substance Use Topics  ? Alcohol use: Yes  ?  Comment: occ  ? Drug use: Never  ? ? ? ?Allergies   ?Aspirin, Penicillins, Sudafed [pseudoephedrine], Codeine, and Erythromycin ? ? ?Review of Systems ?Review of Systems  ?Constitutional:  Negative for chills  and fever.  ?Respiratory:  Positive for cough and shortness of breath.   ?Cardiovascular:  Positive for chest pain and leg swelling. Negative for palpitations.  ?Skin:  Negative for color change and rash.  ?Neurological:  Negative for weakness and numbness.  ?All other systems reviewed and are negative. ? ? ?Physical Exam ?Triage Vital Signs ?ED Triage Vitals [12/04/21 1628]  ?Enc Vitals Group  ?   BP (!) 156/81  ?   Pulse Rate (!) 107  ?   Resp 20  ?   Temp 98.6 ?F (37 ?C)  ?   Temp src   ?   SpO2 96 %  ?   Weight   ?   Height   ?   Head Circumference   ?   Peak Flow   ?   Pain Score   ?   Pain Loc   ?   Pain Edu?   ?   Excl. in [place]?   ? ?No data found. ? ?Updated Vital Signs ?BP (!) 156/81   Pulse (!) 107   Temp 98.6 ?F (37 ?C)   Resp 20   LMP 11/27/2021 (Approximate)   SpO2 96%  ? ?Visual Acuity ?Right Eye Distance:   ?Left Eye Distance:   ?Bilateral Distance:   ? ?Right Eye Near:   ?Left Eye Near:    ?Bilateral  Near:    ? ?Physical Exam ?Vitals and nursing note reviewed.  ?Constitutional:   ?   General: She is not in acute distress. ?   Appearance: She is well-developed. She is obese.  ?HENT:  ?   Mouth/Throat:  ?   Mouth: Mucous membranes are moist.  ?Cardiovascular:  ?   Rate and Rhythm: Regular rhythm. Tachycardia present.  ?   Heart sounds: Normal heart sounds.  ?Pulmonary:  ?   Effort: Pulmonary effort is normal. No respiratory distress.  ?   Breath sounds: Normal breath sounds.  ?Musculoskeletal:  ?   Cervical back: Neck supple.  ?   Right lower leg: Edema present.  ?   Left lower leg: Edema present.  ?   Comments: 1+ bilateral ankle edema.  ?Skin: ?   General: Skin is warm and dry.  ?   Capillary Refill: Capillary refill takes less than 2 seconds.  ?Neurological:  ?   General: No focal deficit present.  ?   Mental Status: She is alert and oriented to person, place, and time.  ?   Sensory: No sensory deficit.  ?   Motor: No weakness.  ?   Gait: Gait normal.  ?Psychiatric:     ?   Mood and Affect: Mood normal.     ?   Behavior: Behavior normal.  ? ? ? ?UC Treatments / Results  ?Labs ?(all labs ordered are listed, but only abnormal results are displayed) ?Labs Reviewed - No data to display ? ?EKG ? ? ?Radiology ?DG Chest 2 View ? ?Result Date: 12/04/2021 ?CLINICAL DATA:  Cough and shortness of breath. EXAM: CHEST - 2 VIEW COMPARISON:  Chest two views 07/11/2020 FINDINGS: Cardiac silhouette and mediastinal contours are within normal limits. The lungs are clear. No pleural effusion or pneumothorax. Mild multilevel degenerative disc changes of the thoracic spine. Cholecystectomy clips. IMPRESSION: No active cardiopulmonary disease. Electronically Signed   By: Yvonne Kendall M.D.   On: 12/04/2021 17:11   ? ?Procedures ?Procedures (including critical care time) ? ?Medications Ordered in UC ?Medications - No data to display ? ?Initial Impression / Assessment  and Plan / UC Course  ?I have reviewed the triage vital signs and the  nursing notes. ? ?Pertinent labs & imaging results that were available during my care of the patient were reviewed by me and considered in my medical decision making (see chart for details). ? ?Shortness of breath, chest pain, ankle edema.  EKG shows tachycardia, rate 120, no ST elevation, no previous to compare.  Chest x-ray negative.  Discussed with patient the limitations of evaluation of her symptoms in an urgent care setting.  She is tachycardic and feels short of breath.  Sending her to the ED for evaluation.  She declines EMS and states she feels stable to drive herself.  She is agreeable to going to the ED. ? ?Final Clinical Impressions(s) / UC Diagnoses  ? ?Final diagnoses:  ?Shortness of breath  ?Chest pain, unspecified type  ?Ankle edema  ? ? ? ?Discharge Instructions   ? ?  ?Go to the emergency department for evaluation of your shortness of breath, chest pain, ankle edema. ? ? ? ? ?ED Prescriptions   ?None ?  ? ?PDMP not reviewed this encounter. ?  ?Sharion Balloon, NP ?12/04/21 1724 ? ?

## 2022-01-29 IMAGING — US US PELVIS COMPLETE TRANSABD/TRANSVAG W DUPLEX
1 series · 13 of 25 positions shown · non-contrast
Comparison: None.

CLINICAL DATA: Heavy vaginal bleeding.

EXAM:
TRANSABDOMINAL AND TRANSVAGINAL ULTRASOUND OF PELVIS
DOPPLER ULTRASOUND OF OVARIES
TECHNIQUE: Both transabdominal and transvaginal ultrasound examinations of the
pelvis were performed. Transabdominal technique was performed for
global imaging of the pelvis including uterus, ovaries, adnexal
regions, and pelvic cul-de-sac.
It was necessary to proceed with endovaginal exam following the
transabdominal exam to visualize the uterus, endometrium, and
ovaries. Color and duplex Doppler ultrasound was utilized to
evaluate blood flow to the ovaries.

[Series 1: us pelvic complete w transvaginal and torsion righ · 13 of 105 slices shown]
[im 1/105]
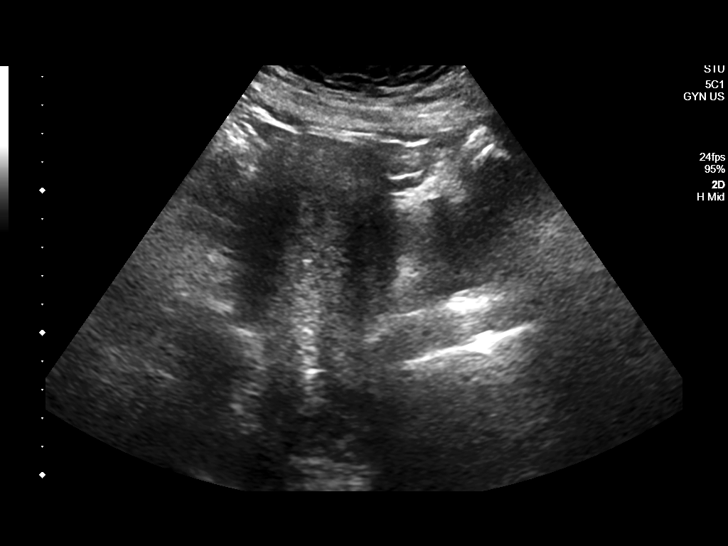
[im 9/105]
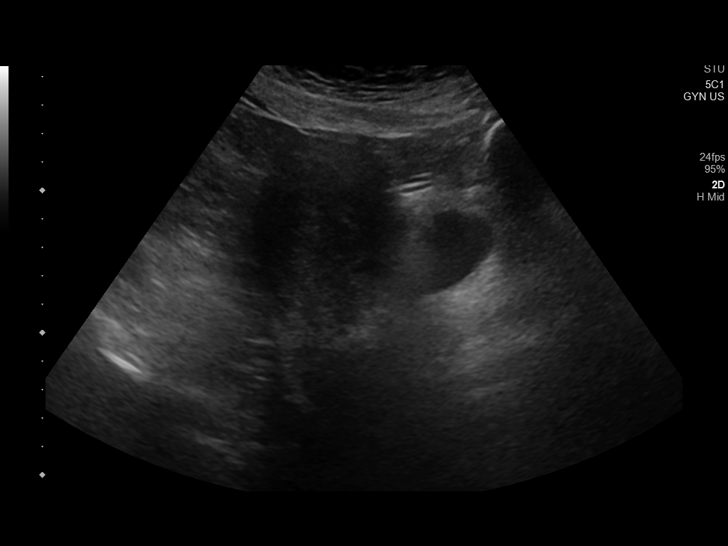
[im 18/105]
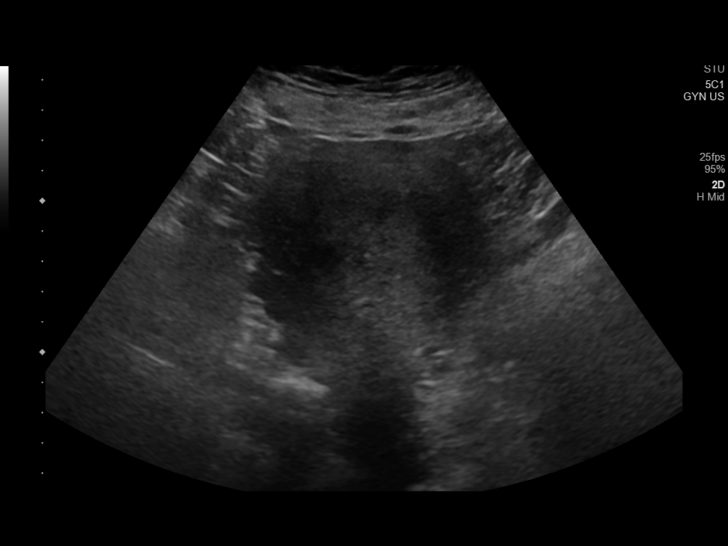
[im 27/105]
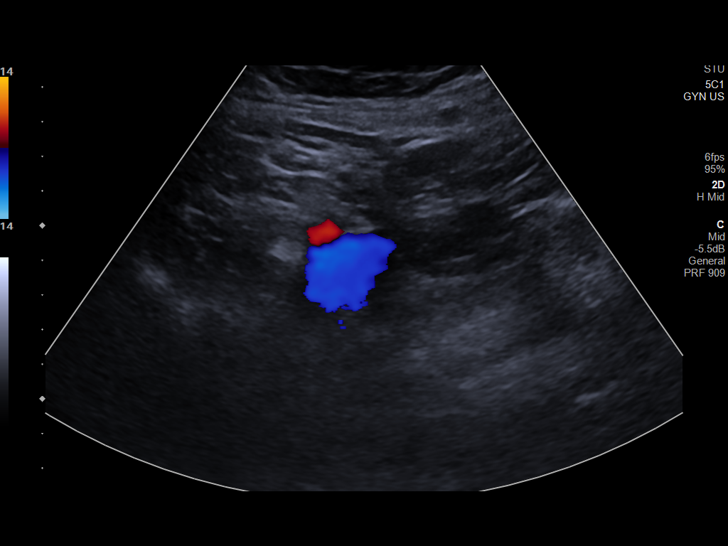
[im 35/105]
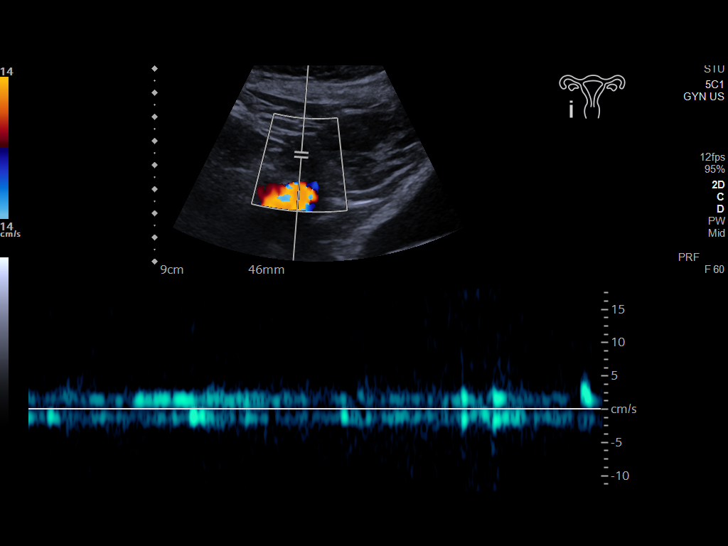
[im 44/105]
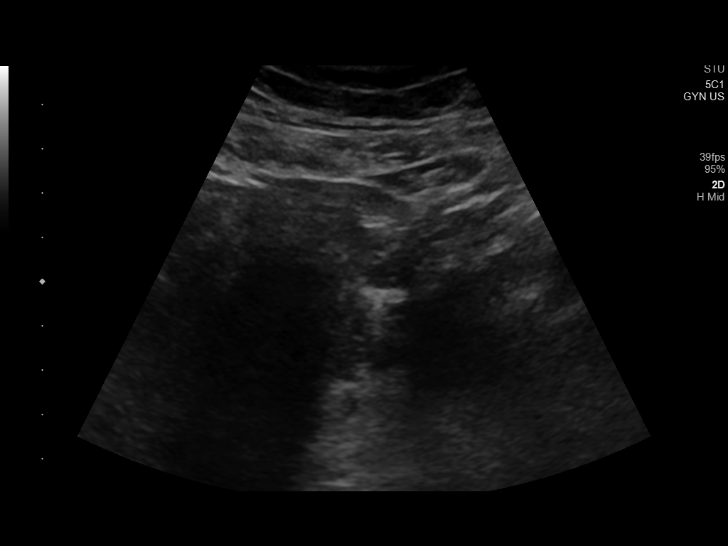
[im 53/105]
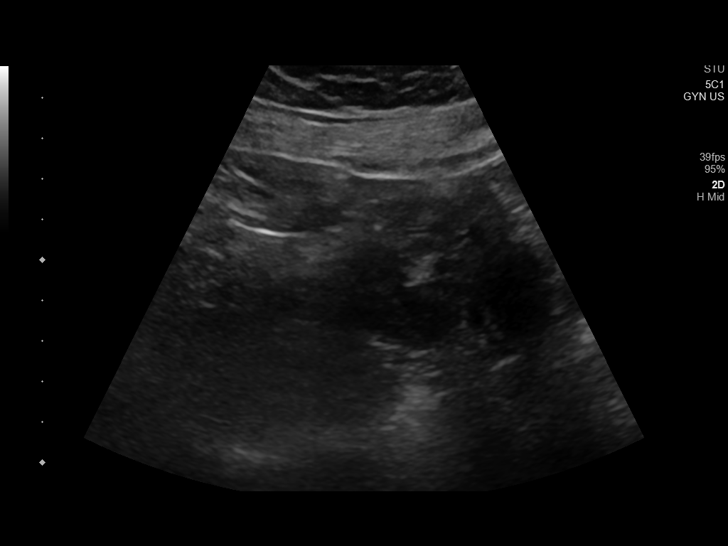
[im 61/105]
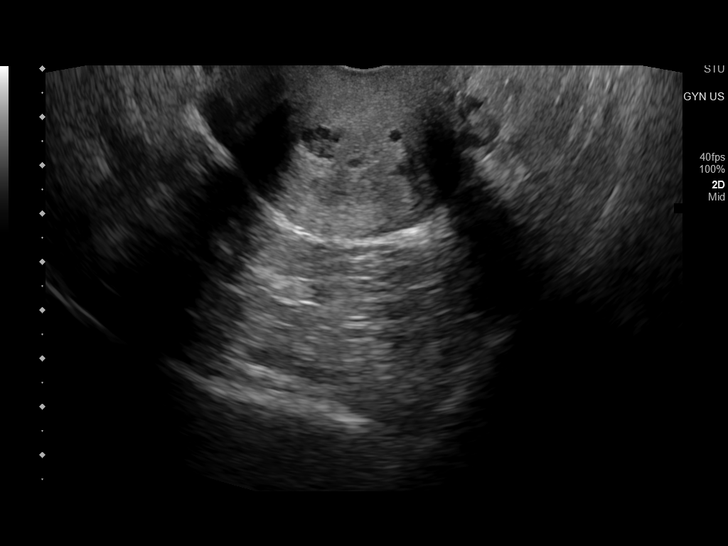
[im 70/105]
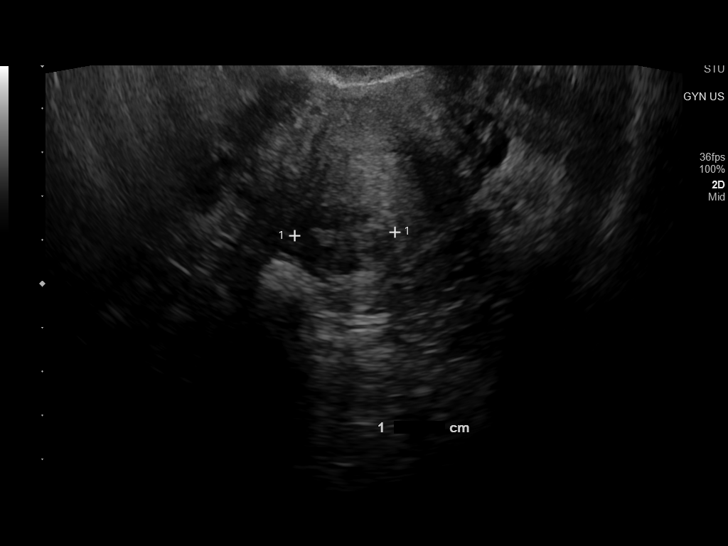
[im 79/105]
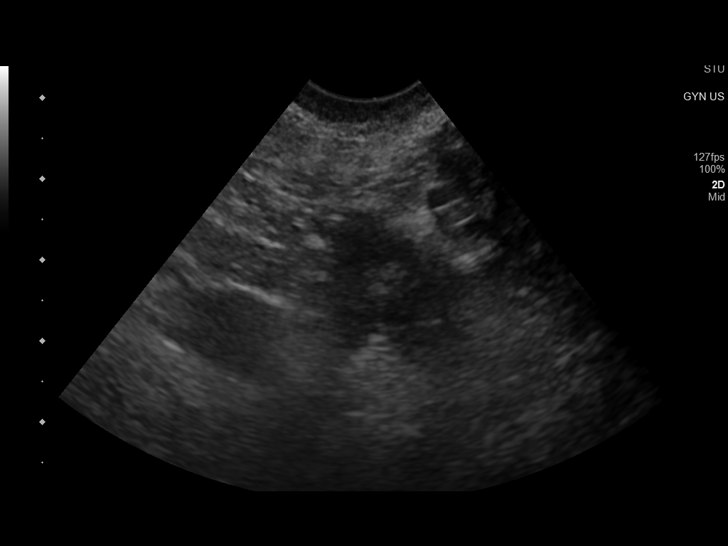
[im 87/105]
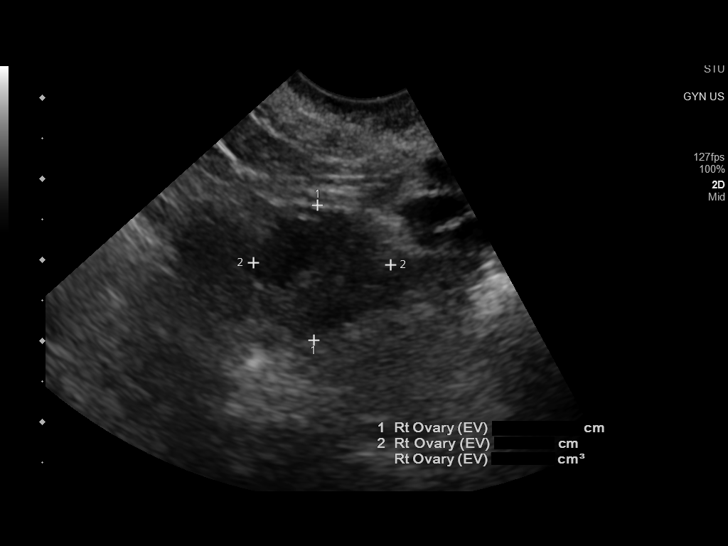
[im 96/105]
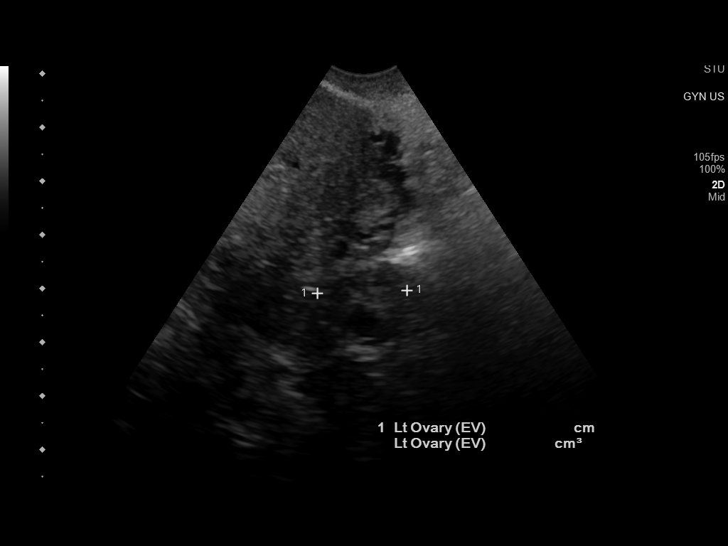
[im 105/105]
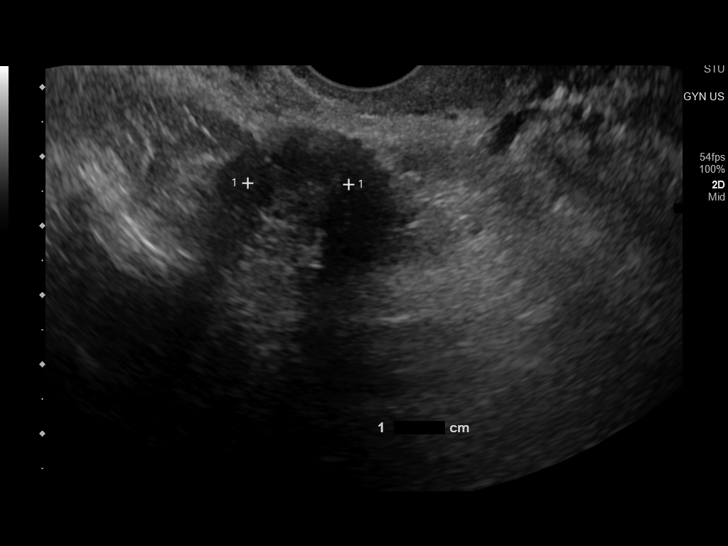

[13 of 25 positions shown; findings below may reference images not displayed]

FINDINGS: Uterus

Measurements: 11.7 x 6.0 x 7.1 cm = volume: 263 mL. 1.2 cm
intramural fibroid in the posterior body. 1.6 cm subserosal fibroid
in the anterior fundus. 2.3 cm intramural fibroid in the posterior
body.

Endometrium

Thickness: 12 mm.  No focal abnormality visualized.

Right ovary

Measurements: 1.7 x 1.7 x 1.5 cm = volume: 2.2 mL. Normal
appearance/no adnexal mass.

Left ovary

Measurements: 2.3 x 1.4 x 1.7 cm = volume: 3 mL. Normal
appearance/no adnexal mass.

Pulsed Doppler evaluation of both ovaries demonstrates normal
low-resistance arterial and venous waveforms.

Other findings

No abnormal free fluid.
IMPRESSION: 1. No acute abnormality.
2. Several small uterine fibroids including a 1.2 cm submucosal
fibroid in the posterior uterine body.

## 2022-03-13 ENCOUNTER — Ambulatory Visit: Payer: Self-pay

## 2022-11-21 ENCOUNTER — Ambulatory Visit
Admission: RE | Admit: 2022-11-21 | Discharge: 2022-11-21 | Disposition: A | Discharge status: Home / Self Care | Payer: Self-pay | Source: Ambulatory Visit | Attending: Emergency Medicine | Admitting: Emergency Medicine

## 2022-11-21 VITALS — BP 125/90 | HR 102 | Temp 99.6°F | Resp 19

## 2022-11-21 DIAGNOSIS — U071 COVID-19: Secondary | ICD-10-CM | POA: Insufficient documentation

## 2022-11-21 MED ORDER — BENZONATATE 100 MG PO CAPS: 100.0000 mg | ORAL_CAPSULE | Freq: Three times a day (TID) | ORAL | 0 refills | Status: DC

## 2022-11-21 MED ORDER — PROMETHAZINE-DM 6.25-15 MG/5ML PO SYRP: 5.0000 mL | ORAL_SOLUTION | Freq: Four times a day (QID) | ORAL | 0 refills | Status: DC | PRN

## 2022-11-21 MED ORDER — MOLNUPIRAVIR EUA 200MG CAPSULE: 4.0000 | ORAL_CAPSULE | Freq: Two times a day (BID) | ORAL | 0 refills | Status: AC

## 2022-11-21 NOTE — ED Triage Notes (Signed)
Fever 103 Wednesday, cough, fatigue, headache, body aches, chills that started Wednesday. Partner was positive for covid on Tuesday night. Taking tylenol.

## 2022-11-21 NOTE — ED Provider Notes (Signed)
Roderic Palau    CSN: [id number] Arrival date & time: 11/21/22  1757      History   Chief Complaint Chief Complaint  Patient presents with   Cough    I believe I have Covid. My spouse was positive two days ago and I have had 102 fever for two days. - Entered by patient    HPI Dana Randolph is a 52 y.o. female.   Patient presents for evaluation of fevers peaking at 101 ,chills, body aches, nasal congestion, rhinorrhea, bilateral ear ringing, sore throat and a nonproductive cough beginning 2 days ago.  Significant other in household has similar symptoms, tested positive for COVID.  Decreased appetite but tolerating some food and fluids.  Has been managing fevers with Tylenol.  History of bronchitis and pneumonia.  Non-smoker.  History of a tonsillectomy.  Past Medical History:  Diagnosis Date   Bronchitis    Pneumonia     Patient Active Problem List   Diagnosis Date Noted   S/P endometrial ablation 08/28/2021   H/O cervical polypectomy 08/28/2021   Abnormal uterine bleeding 01/16/2021   Encounter for screening breast examination 09/08/2019   Breast pain, right 09/08/2019    Past Surgical History:  Procedure Laterality Date   CHOLECYSTECTOMY  2009   DILITATION & CURRETTAGE/HYSTROSCOPY WITH NOVASURE ABLATION N/A 08/19/2021   Procedure: DILATATION & CURETTAGE/HYSTEROSCOPY WITH POLYPECTOMY AND MINERVA ABLATION;  Surgeon: Rubie Maid, MD;  Location: [facility];  Service: Gynecology;  Laterality: N/A;   TONSILLECTOMY  2000    OB History     Gravida  4   Para  4   Term  4   Preterm      AB      Living  4      SAB      IAB      Ectopic      Multiple      Live Births  4            Home Medications    Prior to Admission medications   Medication Sig Start Date End Date Taking? Authorizing Provider  acetaminophen (TYLENOL) 500 MG tablet Take 1,000 mg by mouth every 8 (eight) hours as needed for moderate pain.   Yes [provider]   benzonatate (TESSALON) 100 MG capsule Take 1 capsule (100 mg total) by mouth every 8 (eight) hours. 11/21/22  Yes Meilin Brosh R, NP  molnupiravir EUA (LAGEVRIO) 200 mg CAPS capsule Take 4 capsules (800 mg total) by mouth 2 (two) times daily for 5 days. 11/21/22 11/26/22 Yes Isobelle Tuckett, Leitha Schuller, NP  promethazine-dextromethorphan (PROMETHAZINE-DM) 6.25-15 MG/5ML syrup Take 5 mLs by mouth 4 (four) times daily as needed for cough. 11/21/22  Yes Elsie Sakuma R, NP  albuterol (VENTOLIN HFA) 108 (90 Base) MCG/ACT inhaler Inhale 2 puffs into the lungs every 4 (four) hours as needed for wheezing or shortness of breath. 07/11/20   Faustino Congress, NP    Family History Family History  Problem Relation Age of Onset   Lung cancer Mother    Diabetes Father    COPD Father    Hypertension Father    Heart attack Father    Breast cancer Paternal Aunt    Breast cancer Paternal Aunt    Breast cancer Maternal Great-grandmother     Social History Social History   Tobacco Use   Smoking status: Never   Smokeless tobacco: Never  Vaping Use   Vaping Use: Never used  Substance Use Topics  Alcohol use: Yes    Comment: occ   Drug use: Never     Allergies   Aspirin, Penicillins, Sudafed [pseudoephedrine], Codeine, and Erythromycin   Review of Systems Review of Systems   Physical Exam Triage Vital Signs ED Triage Vitals [11/21/22 1825]  Enc Vitals Group     BP (!) 125/90     Pulse Rate (!) 102     Resp 19     Temp 99.6 F (37.6 C)     Temp Source Oral     SpO2 97 %     Weight      Height      Head Circumference      Peak Flow      Pain Score 6     Pain Loc      Pain Edu?      Excl. in [place]?    No data found.  Updated Vital Signs BP (!) 125/90 (BP Location: Right Arm)   Pulse (!) 102   Temp 99.6 F (37.6 C) (Oral)   Resp 19   LMP 11/21/2022 (Exact Date)   SpO2 97%   Visual Acuity Right Eye Distance:   Left Eye Distance:   Bilateral Distance:    Right Eye Near:    Left Eye Near:    Bilateral Near:     Physical Exam Constitutional:      Appearance: Normal appearance.  HENT:     Head: Normocephalic.     Right Ear: Tympanic membrane, ear canal and external ear normal.     Left Ear: Tympanic membrane, ear canal and external ear normal.     Nose: Congestion and rhinorrhea present.     Mouth/Throat:     Mouth: Mucous membranes are moist.     Pharynx: Posterior oropharyngeal erythema present.  Eyes:     Extraocular Movements: Extraocular movements intact.  Cardiovascular:     Rate and Rhythm: Normal rate and regular rhythm.     Pulses: Normal pulses.     Heart sounds: Normal heart sounds.  Pulmonary:     Effort: Pulmonary effort is normal.     Breath sounds: Normal breath sounds.  Musculoskeletal:     Cervical back: Normal range of motion and neck supple.  Skin:    General: Skin is warm and dry.  Neurological:     Mental Status: She is alert and oriented to person, place, and time. Mental status is at baseline.  Psychiatric:        Mood and Affect: Mood normal.        Behavior: Behavior normal.      UC Treatments / Results  Labs (all labs ordered are listed, but only abnormal results are displayed) Labs Reviewed - No data to display  EKG   Radiology No results found.  Procedures Procedures (including critical care time)  Medications Ordered in UC Medications - No data to display  Initial Impression / Assessment and Plan / UC Course  I have reviewed the triage vital signs and the nursing notes.  Pertinent labs & imaging results that were available during my care of the patient were reviewed by me and considered in my medical decision making (see chart for details).  COVID-19  Most likely caused by COVID virus as significant other is positive, COVID test pending, prescribed antiviral, discussed administration as well as current quarantine guidelines per CDC, prescribed Tessalon Promethazine DM as cough is most worrisome  symptom, recommended additional over-the-counter medications and supportive care, may follow-up with  his urgent care as needed symptoms persist or worsen Final Clinical Impressions(s) / UC Diagnoses   Final diagnoses:  COVID-19     Discharge Instructions      Your symptoms today are most likely being caused by a virus and should steadily improve in time it can take up to 7 to 10 days before you truly start to see a turnaround however things will get better  Current quarantine guidelines are 5 days, may return activity on Monday if still having symptoms at that time please wear mask  Take antiviral every morning and every evening for 5 days to help minimize symptoms, does not fully take away illness  You may use Tessalon pill taking 1 every 8 hours as needed for comfort  You may use cough syrup every 6 hours as needed, be mindful this to make you feel drowsy    You can take Tylenol and/or Ibuprofen as needed for fever reduction and pain relief.   For cough: honey 1/2 to 1 teaspoon (you can dilute the honey in water or another fluid).  You can also use guaifenesin and dextromethorphan for cough. You can use a humidifier for chest congestion and cough.  If you don't have a humidifier, you can sit in the bathroom with the hot shower running.      For sore throat: try warm salt water gargles, cepacol lozenges, throat spray, warm tea or water with lemon/honey, popsicles or ice, or OTC cold relief medicine for throat discomfort.   For congestion: take a daily anti-histamine like Zyrtec, Claritin, and a oral decongestant, such as pseudoephedrine.  You can also use Flonase 1-2 sprays in each nostril daily.   It is important to stay hydrated: drink plenty of fluids (water, gatorade/powerade/pedialyte, juices, or teas) to keep your throat moisturized and help further relieve irritation/discomfort.    ED Prescriptions     Medication Sig Dispense Auth. Provider   molnupiravir EUA (LAGEVRIO) 200  mg CAPS capsule Take 4 capsules (800 mg total) by mouth 2 (two) times daily for 5 days. 40 capsule Ying [place]ocks R, NP   benzonatate (TESSALON) 100 MG capsule Take 1 capsule (100 mg total) by mouth every 8 (eight) hours. 21 capsule Janayia Burggraf R, NP   promethazine-dextromethorphan (PROMETHAZINE-DM) 6.25-15 MG/5ML syrup Take 5 mLs by mouth 4 (four) times daily as needed for cough. 118 mL Roosevelt Eimers, Leitha Schuller, NP      PDMP not reviewed this encounter.   Hans Eden, [place] 11/21/22 [phone]

## 2022-11-21 NOTE — Discharge Instructions (Signed)
Your symptoms today are most likely being caused by a virus and should steadily improve in time it can take up to 7 to 10 days before you truly start to see a turnaround however things will get better  Current quarantine guidelines are 5 days, may return activity on Monday if still having symptoms at that time please wear mask  Take antiviral every morning and every evening for 5 days to help minimize symptoms, does not fully take away illness  You may use Tessalon pill taking 1 every 8 hours as needed for comfort  You may use cough syrup every 6 hours as needed, be mindful this to make you feel drowsy    You can take Tylenol and/or Ibuprofen as needed for fever reduction and pain relief.   For cough: honey 1/2 to 1 teaspoon (you can dilute the honey in water or another fluid).  You can also use guaifenesin and dextromethorphan for cough. You can use a humidifier for chest congestion and cough.  If you don't have a humidifier, you can sit in the bathroom with the hot shower running.      For sore throat: try warm salt water gargles, cepacol lozenges, throat spray, warm tea or water with lemon/honey, popsicles or ice, or OTC cold relief medicine for throat discomfort.   For congestion: take a daily anti-histamine like Zyrtec, Claritin, and a oral decongestant, such as pseudoephedrine.  You can also use Flonase 1-2 sprays in each nostril daily.   It is important to stay hydrated: drink plenty of fluids (water, gatorade/powerade/pedialyte, juices, or teas) to keep your throat moisturized and help further relieve irritation/discomfort.

## 2022-11-22 LAB — SARS CORONAVIRUS 2 (TAT 6-24 HRS): SARS Coronavirus 2: POSITIVE — AB

## 2023-02-09 ENCOUNTER — Ambulatory Visit
Admission: EM | Admit: 2023-02-09 | Discharge: 2023-02-09 | Disposition: A | Discharge status: Home / Self Care | Payer: Medicaid Other

## 2023-02-09 DIAGNOSIS — L03116 Cellulitis of left lower limb: Secondary | ICD-10-CM

## 2023-02-09 DIAGNOSIS — L03115 Cellulitis of right lower limb: Secondary | ICD-10-CM

## 2023-02-09 NOTE — ED Triage Notes (Signed)
Patient presents to UC for BLE redness, pain, and swelling since yesterday. States she did have tick bite to left chest area a week ago. And additional insect bites to her back side. Concerned with cellulitis. Had e-visit and was instructed to come in for eval.

## 2023-02-09 NOTE — Discharge Instructions (Signed)
You were evaluated for the redness in the legs and swelling most consistent with infection  Begin doxycycline every morning and every evening for 7 days  Tomorrow take prednisone every morning with food for 5 days to help reduce swelling  Additionally elevate legs whenever sitting and lying  You may use compression socks or hosiery which may found at most stores to help with circulation and to reduce swelling  May take Tylenol every 6 hours as needed for additional comfort  For any concerns please follow-up for reevaluation

## 2023-02-09 NOTE — ED Provider Notes (Signed)
Renaldo Fiddler    CSN: [id number] Arrival date & time: 02/09/23  1742      History   Chief Complaint Chief Complaint  Patient presents with   Leg Pain    Redness, fever, painful and lower swollen legs - Entered by patient    HPI Dana Randolph is a 52 y.o. female.   She presents for evaluation of bilateral lower leg redness, swelling and pain beginning 1 day ago.  Worsening in severity this morning.  Denies injury or trauma to the leg.  Able to bear weight.  Has not attempted treatment.  Endorses a tick bite to the abdomen 1 week prior as well as several unknown insect bites to the back.  Attempted to complete a telehealth visit and recommended in person evaluation.  Past Medical History:  Diagnosis Date   Bronchitis    Pneumonia     Patient Active Problem List   Diagnosis Date Noted   S/P endometrial ablation 08/28/2021   H/O cervical polypectomy 08/28/2021   Abnormal uterine bleeding 01/16/2021   Encounter for screening breast examination 09/08/2019   Breast pain, right 09/08/2019    Past Surgical History:  Procedure Laterality Date   CHOLECYSTECTOMY  2009   DILITATION & CURRETTAGE/HYSTROSCOPY WITH NOVASURE ABLATION N/A 08/19/2021   Procedure: DILATATION & CURETTAGE/HYSTEROSCOPY WITH POLYPECTOMY AND MINERVA ABLATION;  Surgeon: Hildred Laser, MD;  Location: [facility];  Service: Gynecology;  Laterality: N/A;   TONSILLECTOMY  2000    OB History     Gravida  4   Para  4   Term  4   Preterm      AB      Living  4      SAB      IAB      Ectopic      Multiple      Live Births  4            Home Medications    Prior to Admission medications   Medication Sig Start Date End Date Taking? Authorizing Provider  acetaminophen (TYLENOL) 500 MG tablet Take 1,000 mg by mouth every 8 (eight) hours as needed for moderate pain.    [provider]  albuterol (VENTOLIN HFA) 108 (90 Base) MCG/ACT inhaler Inhale 2 puffs into the lungs every 4  (four) hours as needed for wheezing or shortness of breath. 07/11/20   Moshe Cipro, NP  benzonatate (TESSALON) 100 MG capsule Take 1 capsule (100 mg total) by mouth every 8 (eight) hours. 11/21/22   Valinda Hoar, NP  promethazine-dextromethorphan (PROMETHAZINE-DM) 6.25-15 MG/5ML syrup Take 5 mLs by mouth 4 (four) times daily as needed for cough. 11/21/22   Valinda Hoar, NP    Family History Family History  Problem Relation Age of Onset   Lung cancer Mother    Diabetes Father    COPD Father    Hypertension Father    Heart attack Father    Breast cancer Paternal Aunt    Breast cancer Paternal Aunt    Breast cancer Maternal Great-grandmother     Social History Social History   Tobacco Use   Smoking status: Never   Smokeless tobacco: Never  Vaping Use   Vaping Use: Never used  Substance Use Topics   Alcohol use: Yes    Comment: occ   Drug use: Never     Allergies   Aspirin, Penicillins, Sudafed [pseudoephedrine], Codeine, and Erythromycin   Review of Systems Review of Systems   Physical Exam  Triage Vital Signs ED Triage Vitals [02/09/23 1808]  Enc Vitals Group     BP      Pulse      Resp      Temp      Temp src      SpO2      Weight      Height      Head Circumference      Peak Flow      Pain Score 0     Pain Loc      Pain Edu?      Excl. in [place]?    No data found.  Updated Vital Signs LMP 02/09/2023 (Exact Date)   Visual Acuity Right Eye Distance:   Left Eye Distance:   Bilateral Distance:    Right Eye Near:   Left Eye Near:    Bilateral Near:     Physical Exam Constitutional:      Appearance: Normal appearance.  Eyes:     Extraocular Movements: Extraocular movements intact.  Pulmonary:     Effort: Pulmonary effort is normal.  Musculoskeletal:     Comments: 1+ bilateral feet edema, nonpitting edema to the bilateral lower extremities, erythema present bilaterally to the anterior of the shins with generalized tenderness   Neurological:     Mental Status: She is alert and oriented to person, place, and time. Mental status is at baseline.      UC Treatments / Results  Labs (all labs ordered are listed, but only abnormal results are displayed) Labs Reviewed - No data to display  EKG   Radiology No results found.  Procedures Procedures (including critical care time)  Medications Ordered in UC Medications - No data to display  Initial Impression / Assessment and Plan / UC Course  I have reviewed the triage vital signs and the nursing notes.  Pertinent labs & imaging results that were available during my care of the patient were reviewed by me and considered in my medical decision making (see chart for details).  Cellulitis of the right and left lower extremity  Presentation most consistent with infection, discussed with this patient as symptoms are bilateral low suspicion for DVT, no history of cardiac or nephrotic conditions, prescribe doxycycline and prednisone for treatment, and recommended additional supportive measures through over-the-counter analgesics, elevation with activity as tolerated, given strict precautions to follow-up if symptoms continue to persist Final Clinical Impressions(s) / UC Diagnoses   Final diagnoses:  Cellulitis of left lower extremity  Cellulitis of right lower extremity     Discharge Instructions      You were evaluated for the redness in the legs and swelling most consistent with infection  Begin doxycycline every morning and every evening for 7 days  Tomorrow take prednisone every morning with food for 5 days to help reduce swelling  Additionally elevate legs whenever sitting and lying  You may use compression socks or hosiery which may found at most stores to help with circulation and to reduce swelling  May take Tylenol every 6 hours as needed for additional comfort  For any concerns please follow-up for reevaluation   ED Prescriptions   None     PDMP not reviewed this encounter.   Valinda Hoar, [place] 02/09/23 [phone]

## 2023-02-10 ENCOUNTER — Telehealth: Payer: Self-pay | Admitting: Emergency Medicine

## 2023-02-10 MED ORDER — DOXYCYCLINE HYCLATE 100 MG PO CAPS: 100.0000 mg | ORAL_CAPSULE | Freq: Two times a day (BID) | ORAL | 0 refills | Status: DC

## 2023-02-10 MED ORDER — PREDNISONE 20 MG PO TABS: 40.0000 mg | ORAL_TABLET | Freq: Every day | ORAL | 0 refills | Status: DC

## 2023-02-10 NOTE — Telephone Encounter (Signed)
Meds form visit on 02/09/23 sent to pharmacy

## 2023-05-29 ENCOUNTER — Emergency Department
Admission: EM | Admit: 2023-05-29 | Discharge: 2023-05-29 | Disposition: A | Discharge status: Home / Self Care | Payer: Self-pay | Attending: Emergency Medicine | Admitting: Emergency Medicine

## 2023-05-29 ENCOUNTER — Emergency Department: Payer: Self-pay

## 2023-05-29 ENCOUNTER — Encounter: Payer: Self-pay | Admitting: Emergency Medicine

## 2023-05-29 ENCOUNTER — Other Ambulatory Visit: Payer: Self-pay

## 2023-05-29 DIAGNOSIS — R079 Chest pain, unspecified: Secondary | ICD-10-CM

## 2023-05-29 DIAGNOSIS — Z1152 Encounter for screening for COVID-19: Secondary | ICD-10-CM | POA: Insufficient documentation

## 2023-05-29 DIAGNOSIS — R791 Abnormal coagulation profile: Secondary | ICD-10-CM | POA: Insufficient documentation

## 2023-05-29 LAB — CBC WITH DIFFERENTIAL/PLATELET
Abs Immature Granulocytes: 0.04 10*3/uL (ref 0.00–0.07)
Basophils Absolute: 0 10*3/uL (ref 0.0–0.1)
Basophils Relative: 0 %
Eosinophils Absolute: 0.1 10*3/uL (ref 0.0–0.5)
Eosinophils Relative: 1 %
HCT: 40.4 % (ref 36.0–46.0)
Hemoglobin: 13.4 g/dL (ref 12.0–15.0)
Immature Granulocytes: 0 %
Lymphocytes Relative: 17 %
Lymphs Abs: 1.8 10*3/uL (ref 0.7–4.0)
MCH: 28.2 pg (ref 26.0–34.0)
MCHC: 33.2 g/dL (ref 30.0–36.0)
MCV: 84.9 fL (ref 80.0–100.0)
Monocytes Absolute: 0.7 10*3/uL (ref 0.1–1.0)
Monocytes Relative: 7 %
Neutro Abs: 7.7 10*3/uL (ref 1.7–7.7)
Neutrophils Relative %: 75 %
Platelets: 259 10*3/uL (ref 150–400)
RBC: 4.76 MIL/uL (ref 3.87–5.11)
RDW: 12.8 % (ref 11.5–15.5)
WBC: 10.3 10*3/uL (ref 4.0–10.5)
nRBC: 0 % (ref 0.0–0.2)

## 2023-05-29 LAB — COMPREHENSIVE METABOLIC PANEL
ALT: 18 U/L (ref 0–44)
AST: 14 U/L — ABNORMAL LOW (ref 15–41)
Albumin: 3.8 g/dL (ref 3.5–5.0)
Alkaline Phosphatase: 91 U/L (ref 38–126)
Anion gap: 11 (ref 5–15)
BUN: 16 mg/dL (ref 6–20)
CO2: 25 mmol/L (ref 22–32)
Calcium: 9.2 mg/dL (ref 8.9–10.3)
Chloride: 104 mmol/L (ref 98–111)
Creatinine, Ser: 0.86 mg/dL (ref 0.44–1.00)
GFR, Estimated: >60 mL/min (ref >60)
Glucose, Bld: 180 mg/dL — ABNORMAL HIGH (ref 70–99)
Potassium: 3.8 mmol/L (ref 3.5–5.1)
Sodium: 140 mmol/L (ref 135–145)
Total Bilirubin: 0.8 mg/dL (ref 0.3–1.2)
Total Protein: 7.3 g/dL (ref 6.5–8.1)

## 2023-05-29 LAB — TROPONIN I (HIGH SENSITIVITY)
Troponin I (High Sensitivity): <2 ng/L (ref <18)
Troponin I (High Sensitivity): <2 ng/L (ref <18)

## 2023-05-29 LAB — D-DIMER, QUANTITATIVE: D-Dimer, Quant: 0.67 ug/mL-FEU — ABNORMAL HIGH (ref 0.00–0.50)

## 2023-05-29 LAB — LIPASE, BLOOD: Lipase: 29 U/L (ref 11–51)

## 2023-05-29 LAB — SARS CORONAVIRUS 2 BY RT PCR: SARS Coronavirus 2 by RT PCR: NEGATIVE

## 2023-05-29 MED ORDER — IOHEXOL 350 MG/ML SOLN
100.0000 mL | Freq: Once | INTRAVENOUS | Status: AC | PRN
  Administered 2023-05-29: 100 mL via INTRAVENOUS

## 2023-05-29 NOTE — ED Provider Notes (Signed)
[facility] Provider Note    Event Date/Time   First MD Initiated Contact with Patient 05/29/23 1726     (approximate)   History   Chief Complaint: Chest Pain   HPI  Verga Dabney is a 52 y.o. female with no significant past medical history who complains of chest heaviness that started 3 days ago.  Its intermittent, lasting about a minute at a time and then resolving spontaneously.  Nonradiating.  No shortness of breath diaphoresis or vomiting.  Not pleuritic or exertional.  Reports a feeling of unease, but unable to specify any other symptoms.  Reports normal oral intake, no vomiting diarrhea chills body aches or other acute symptoms.  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  She has had extensive cardiac workup without any identifiable pathology.     Physical Exam   Triage Vital Signs: ED Triage Vitals  Encounter Vitals Group     BP 05/29/23 1723 (!) 145/96     Systolic BP Percentile --      Diastolic BP Percentile --      Pulse Rate 05/29/23 1723 (!) 107     Resp 05/29/23 1723 18     Temp 05/29/23 1723 98.4 F (36.9 C)     Temp Source 05/29/23 1723 Oral     SpO2 05/29/23 1723 98 %     Weight 05/29/23 1721 228 lb (103.4 kg)     Height 05/29/23 1721 5\' 5"  (1.651 m)     Head Circumference --      Peak Flow --      Pain Score 05/29/23 1720 3     Pain Loc --      Pain Education --      Exclude from Growth Chart --     Most recent vital signs: Vitals:   05/29/23 2118 05/29/23 2130  BP: 125/85   Pulse: 96 95  Resp: (!) 24 (!) 21  Temp:    SpO2: 97% 96%    General: Awake, no distress.  CV:  Good peripheral perfusion.  Regular rate and rhythm Resp:  Normal effort.  Clear to auscultation bilaterally Abd:  No distention.  Soft nontender    ED Results / Procedures / Treatments   Labs (all labs ordered are listed, but only abnormal results are displayed) Labs Reviewed  COMPREHENSIVE METABOLIC PANEL - Abnormal;  Notable for the following components:      Result Value   Glucose, Bld 180 (*)    AST 14 (*)    All other components within normal limits  D-DIMER, QUANTITATIVE - Abnormal; Notable for the following components:   D-Dimer, Quant 0.67 (*)    All other components within normal limits  SARS CORONAVIRUS 2 BY RT PCR  LIPASE, BLOOD  CBC WITH DIFFERENTIAL/PLATELET  TROPONIN I (HIGH SENSITIVITY)  TROPONIN I (HIGH SENSITIVITY)     EKG Interpreted by me Sinus tachycardia rate 114.  Normal axis, normal intervals.  Normal QRS ST segments and T waves   RADIOLOGY Chest x-ray interpreted by me, unremarkable.  Radiology report reviewed   PROCEDURES:  Procedures   MEDICATIONS ORDERED IN ED: Medications  iohexol (OMNIPAQUE) 350 MG/ML injection 100 mL (100 mLs Intravenous Contrast Given 05/29/23 2009)     IMPRESSION / MDM / ASSESSMENT AND PLAN / ED COURSE  I reviewed the triage vital signs and the nursing notes.  DDx: GERD, muscle spasm, non-STEMI, pulmonary embolism, pneumothorax, pneumonia  Patient's presentation is most consistent with acute presentation with  potential threat to life or bodily function.  Patient presents with atypical chest discomfort.  Due to the nature of symptoms and persistence, will need to obtain labs including troponin and D-dimer.   ----------------------------------------- 11:17 PM on 05/29/2023 ----------------------------------------- Labs unremarkable except for D-dimer elevated.  CTA chest negative.  Doubt unstable angina, suspect GERD.  Stable for discharge.      FINAL CLINICAL IMPRESSION(S) / ED DIAGNOSES   Final diagnoses:  Nonspecific chest pain     Rx / DC Orders   ED Discharge Orders          Ordered    Ambulatory Referral to Primary Care (Establish Care)        05/29/23 2316             Note:  This document was prepared using Dragon voice recognition software and may include unintentional dictation errors.   Sharman Cheek, MD 05/29/23 [phone]

## 2023-05-29 NOTE — Discharge Instructions (Addendum)
Your labs and CT scan today were all reassuring.  The CT scan did incidentally find some asymmetry of your thyroid gland, and you should follow-up with primary care to further evaluate that finding.  Try antacids such as Tums or Pepcid at home to see if this relieves any remaining symptoms.   1. No evidence of pulmonary embolism or acute cardiopulmonary  disease.  2. Asymmetric enlargement of the left lobe of the thyroid gland.  Further evaluation with nonemergent thyroid ultrasound is  recommended.

## 2023-05-29 NOTE — ED Triage Notes (Signed)
Pt to ER with c/o chest heaviness that started on Tuesday evening.  States pain and heaviness has come and gone since that time.  Pt states nausea that started yesterday and pain to the left side of her neck as well.

## 2023-07-24 ENCOUNTER — Ambulatory Visit: Payer: Medicaid Other | Admitting: Family Medicine

## 2023-07-24 NOTE — Progress Notes (Deleted)
New patient visit   Patient: Dana Randolph   DOB: 12-13-1970   52 y.o. Female  MRN: [id number] Visit Date: 07/24/2023  Today's healthcare provider: Ronnald Ramp, MD   No chief complaint on file.  Subjective    Dana Randolph is a 52 y.o. female who presents today as a new patient to establish care.      Discussed the use of AI scribe software for clinical note transcription with the patient, who gave verbal consent to proceed.  History of Present Illness              Past Medical History:  Diagnosis Date   Bronchitis    Pneumonia     Outpatient Medications Prior to Visit  Medication Sig   acetaminophen (TYLENOL) 500 MG tablet Take 1,000 mg by mouth every 8 (eight) hours as needed for moderate pain.   albuterol (VENTOLIN HFA) 108 (90 Base) MCG/ACT inhaler Inhale 2 puffs into the lungs every 4 (four) hours as needed for wheezing or shortness of breath.   benzonatate (TESSALON) 100 MG capsule Take 1 capsule (100 mg total) by mouth every 8 (eight) hours. (Patient not taking: Reported on 05/29/2023)   doxycycline (VIBRAMYCIN) 100 MG capsule Take 1 capsule (100 mg total) by mouth 2 (two) times daily. (Patient not taking: Reported on 05/29/2023)   predniSONE (DELTASONE) 20 MG tablet Take 2 tablets (40 mg total) by mouth daily. (Patient not taking: Reported on 05/29/2023)   promethazine-dextromethorphan (PROMETHAZINE-DM) 6.25-15 MG/5ML syrup Take 5 mLs by mouth 4 (four) times daily as needed for cough.   No facility-administered medications prior to visit.    Past Surgical History:  Procedure Laterality Date   CHOLECYSTECTOMY  2009   DILITATION & CURRETTAGE/HYSTROSCOPY WITH NOVASURE ABLATION N/A 08/19/2021   Procedure: DILATATION & CURETTAGE/HYSTEROSCOPY WITH POLYPECTOMY AND MINERVA ABLATION;  Surgeon: Hildred Laser, MD;  Location: [facility] ORS;  Service: Gynecology;  Laterality: N/A;   TONSILLECTOMY  2000   Family Status  Relation Name Status   Mother  Alive    Father  Alive   Dennie Bible Aunt  Alive   Pat Aunt  Deceased   Maternal GGM  Deceased  No partnership data on file   Family History  Problem Relation Age of Onset   Lung cancer Mother    Diabetes Father    COPD Father    Hypertension Father    Heart attack Father    Breast cancer Paternal Aunt    Breast cancer Paternal Aunt    Breast cancer Maternal Great-grandmother    Social History   Socioeconomic History   Marital status: Married    Spouse name: Not on file   Number of children: Not on file   Years of education: Not on file   Highest education level: Bachelor's degree (e.g., BA, AB, BS)  Occupational History   Not on file  Tobacco Use   Smoking status: Never   Smokeless tobacco: Never  Vaping Use   Vaping status: Never Used  Substance and Sexual Activity   Alcohol use: Yes    Comment: occ   Drug use: Never   Sexual activity: Not Currently    Birth control/protection: None  Other Topics Concern   Not on file  Social History Narrative   Lives at home with wife.   Social Determinants of Health   Financial Resource Strain: Not on file  Food Insecurity: Not on file  Transportation Needs: No Transportation Needs (09/08/2019)   PRAPARE - Transportation  Lack of Transportation (Medical): No    Lack of Transportation (Non-Medical): No  Physical Activity: Not on file  Stress: Not on file  Social Connections: Not on file     Allergies  Allergen Reactions   Aspirin Anaphylaxis    Throat swelling   Penicillins Anaphylaxis    Throat swelling   Sudafed [Pseudoephedrine] Anaphylaxis    Throat swelling   Codeine Nausea And Vomiting   Erythromycin Nausea And Vomiting     There is no immunization history on file for this patient.  Health Maintenance  Topic Date Due   HIV Screening  Never done   Hepatitis C Screening  Never done   DTaP/Tdap/Td (1 - Tdap) Never done   Colonoscopy  Never done   INFLUENZA VACCINE  Never done   COVID-19 Vaccine (1 - 2023-24  season) Never done   MAMMOGRAM  05/16/2023   Zoster Vaccines- Shingrix (1 of 2) Never done   Cervical Cancer Screening (HPV/Pap Cotest)  08/22/2025   HPV VACCINES  Aged Out    Patient Care Team: Pcp, No as PCP - General  Review of Systems  {Insert previous labs (optional):23779} {See past labs  Heme  Chem  Endocrine  Serology  Results Review (optional):1}   Objective    There were no vitals taken for this visit. {Insert last BP/Wt (optional):23777}{See vitals history (optional):1}    Depression Screen     No data to display         No results found for any visits on 07/24/23.   Physical Exam ***    Assessment & Plan      Problem List Items Addressed This Visit   None   Assessment and Plan               No follow-ups on file.      Ronnald Ramp, MD  [facility] [phone] (phone) [phone] (fax)  [facility]

## 2023-09-18 ENCOUNTER — Ambulatory Visit: Payer: Self-pay | Admitting: Family Medicine

## 2023-09-18 VITALS — BP 122/88 | HR 99 | Ht 65.0 in | Wt 253.6 lb

## 2023-09-18 DIAGNOSIS — Z7689 Persons encountering health services in other specified circumstances: Secondary | ICD-10-CM

## 2023-09-18 DIAGNOSIS — I83893 Varicose veins of bilateral lower extremities with other complications: Secondary | ICD-10-CM

## 2023-09-18 DIAGNOSIS — Z8349 Family history of other endocrine, nutritional and metabolic diseases: Secondary | ICD-10-CM

## 2023-09-18 DIAGNOSIS — Z13220 Encounter for screening for lipoid disorders: Secondary | ICD-10-CM

## 2023-09-18 DIAGNOSIS — Z1231 Encounter for screening mammogram for malignant neoplasm of breast: Secondary | ICD-10-CM

## 2023-09-18 DIAGNOSIS — E1165 Type 2 diabetes mellitus with hyperglycemia: Secondary | ICD-10-CM

## 2023-09-18 DIAGNOSIS — F900 Attention-deficit hyperactivity disorder, predominantly inattentive type: Secondary | ICD-10-CM | POA: Insufficient documentation

## 2023-09-18 DIAGNOSIS — R198 Other specified symptoms and signs involving the digestive system and abdomen: Secondary | ICD-10-CM | POA: Insufficient documentation

## 2023-09-18 DIAGNOSIS — E66813 Obesity, class 3: Secondary | ICD-10-CM

## 2023-09-18 DIAGNOSIS — Z8249 Family history of ischemic heart disease and other diseases of the circulatory system: Secondary | ICD-10-CM

## 2023-09-18 DIAGNOSIS — Z6841 Body Mass Index (BMI) 40.0 and over, adult: Secondary | ICD-10-CM

## 2023-09-18 DIAGNOSIS — R030 Elevated blood-pressure reading, without diagnosis of hypertension: Secondary | ICD-10-CM

## 2023-09-18 MED ORDER — AMPHETAMINE-DEXTROAMPHET ER 10 MG PO CP24
10.0000 mg | ORAL_CAPSULE | Freq: Every day | ORAL | 0 refills | Status: DC
Start: 2023-09-18 — End: 2023-10-22

## 2023-09-18 NOTE — Patient Instructions (Signed)
 VISIT SUMMARY:  Today, we discussed several of your health concerns and established a plan to address each one. We reviewed your gastrointestinal issues, ADHD, blood pressure, and general health maintenance needs. We also discussed your interest in restarting Adderall and the need for routine screenings and lab tests.  YOUR PLAN:  -GASTROINTESTINAL ISSUES: You have been experiencing chronic gastrointestinal symptoms, including alternating constipation and diarrhea, and abdominal cramps after meals. These symptoms may be due to conditions like irritable bowel syndrome (IBS) or inflammatory bowel disease (IBD). We will refer you to a gastroenterologist for further evaluation and a possible colonoscopy.  -ADHD: Attention Deficit Hyperactivity Disorder (ADHD) is a condition that affects your ability to focus and can cause hyperactivity. You have a history of ADHD and have been off medication for four years. We will restart Adderall at a low dose of 10 mg once daily to help manage your symptoms. Please follow up in one month to assess how the medication is working.  -HYPERTENSION: Your blood pressure was slightly elevated today, which may be related to stress. We discussed that Adderall can affect blood pressure, so we will monitor it closely. Please recheck your blood pressure at your next visit.  -OBESITY: Your BMI is 42.2, which falls into the obesity category. We discussed the challenges of weight management and the importance of lifestyle modifications. We will continue to monitor your weight and provide support for healthy lifestyle changes.  -GENERAL HEALTH MAINTENANCE: As part of your routine health maintenance, we will order several screenings and lab tests. These include a mammogram for breast cancer screening, a colonoscopy for colon cancer screening, a complete metabolic panel, a complete blood count (CBC), a lipid panel, a hemoglobin A1c test for diabetes screening, and a thyroid-stimulating  hormone (TSH) test.  INSTRUCTIONS:  Please follow up in one month to assess your response to Adderall and review your lab results. Additionally, schedule appointments for your mammogram and colonoscopy as soon as possible.

## 2023-09-18 NOTE — Progress Notes (Signed)
 New patient visit   Patient: Dana Randolph   DOB: 04-Jun-1971   53 y.o. Female  MRN: [id number] Visit Date: 09/18/2023  Today's healthcare provider: Rockie Agent, MD   Chief Complaint  Patient presents with   New Patient (Initial Visit)    Establish care and have lab work completed    Subjective    Dana Randolph is a 53 y.o. female who presents today as a new patient to establish care.   HPI     New Patient (Initial Visit)    Additional comments: Establish care and have lab work completed         Comments   Mammogram-ok to order Colonoscopy- would like to discuss [employer] option first HIV and Hep C Screening Declined      Last edited by Lilian Fitzpatrick, CMA on 09/18/2023 12:11 PM.       Discussed the use of AI scribe software for clinical note transcription with the patient, who gave verbal consent to proceed.  History of Present Illness   The patient, a 53 year old individual, presents to establish care and to discuss several health concerns. The patient reports a history of gastrointestinal issues, including alternating constipation and diarrhea, which have persisted since her last childbirth over two decades ago. She describes occasional stomach cramps post meals, despite having had her gallbladder removed in 2006. The patient expresses interest in a blood test called shield for colon cancer screening due to these ongoing symptoms and a family history of gastrointestinal issues, although not specifically colon cancer.  The patient also reports a history of Attention Deficit Hyperactivity Disorder (ADHD), diagnosed in early childhood. She had been on Adderall, which she discontinued four years ago due to a change in primary care provider. The patient reports difficulty sleeping, often getting only 2-3 hours of sleep per night, and feels constantly on the go. She expresses a desire to restart Adderall to help manage these  symptoms.  Additionally, the patient has a history of varicose veins, which have been present since her teenage years. She reports no associated swelling or discomfort. The patient also mentions a history of heavy menstrual bleeding despite having undergone an ablation procedure two years ago.  The patient's blood pressure was noted to be slightly elevated during the visit, which she attributes to high stress levels, particularly following the loss of her child. She also reports a significant amount of stress related to her job, which requires constant attention seven days a week, and caregiving responsibilities at home.  The patient is seeking routine health maintenance, including ordering of a mammogram, colon cancer screening, and various lab tests such as a complete metabolic panel, CBC, lipid panel, hemoglobin A1c, and TSH. She declined Hepatitis C and HIV screening.       Past Medical History:  Diagnosis Date   Allergy    Bronchitis    Pneumonia     Outpatient Medications Prior to Visit  Medication Sig   acetaminophen  (TYLENOL ) 500 MG tablet Take 1,000 mg by mouth every 8 (eight) hours as needed for moderate pain (pain score 4-6). (Patient not taking: Reported on 09/18/2023)   [DISCONTINUED] albuterol  (VENTOLIN  HFA) 108 (90 Base) MCG/ACT inhaler Inhale 2 puffs into the lungs every 4 (four) hours as needed for wheezing or shortness of breath.   [DISCONTINUED] benzonatate  (TESSALON ) 100 MG capsule Take 1 capsule (100 mg total) by mouth every 8 (eight) hours. (Patient not taking: Reported on 05/29/2023)   [DISCONTINUED] doxycycline  (VIBRAMYCIN ) 100 MG capsule Take  1 capsule (100 mg total) by mouth 2 (two) times daily. (Patient not taking: Reported on 05/29/2023)   [DISCONTINUED] predniSONE  (DELTASONE ) 20 MG tablet Take 2 tablets (40 mg total) by mouth daily. (Patient not taking: Reported on 05/29/2023)   [DISCONTINUED] promethazine -dextromethorphan (PROMETHAZINE -DM) 6.25-15 MG/5ML syrup Take 5  mLs by mouth 4 (four) times daily as needed for cough.   No facility-administered medications prior to visit.    Past Surgical History:  Procedure Laterality Date   CHOLECYSTECTOMY  2009   DILITATION & CURRETTAGE/HYSTROSCOPY WITH NOVASURE ABLATION N/A 08/19/2021   Procedure: DILATATION & CURETTAGE/HYSTEROSCOPY WITH POLYPECTOMY AND MINERVA ABLATION;  Surgeon: Connell Davies, MD;  Location: [facility] ORS;  Service: Gynecology;  Laterality: N/A;   TONSILLECTOMY  2000   Family Status  Relation Name Status   Mother Ricka Alive   Father Ronnie Alive   Daughter Oktaha (Not Specified)   Son Severa Grieves (Not Specified)   Bruna Aunt  Alive   Bruna Aunt  Deceased   Maternal GGM  Deceased  No partnership data on file   Family History  Problem Relation Age of Onset   Lung cancer Mother 65 - 20   ADD / ADHD Mother    Alcohol abuse Mother    Thyroid cancer Mother 74 - 20   Skin cancer Mother 22 - 83   Diabetes Father    COPD Father    Hypertension Father    Heart attack Father    Alcohol abuse Father    Heart disease Father    ADD / ADHD Daughter    ADD / ADHD Son    Breast cancer Paternal Aunt    Breast cancer Paternal Aunt    Breast cancer Maternal Great-grandmother    Social History   Socioeconomic History   Marital status: Married    Spouse name: Not on file   Number of children: Not on file   Years of education: Not on file   Highest education level: Bachelor's degree (e.g., BA, AB, BS)  Occupational History   Not on file  Tobacco Use   Smoking status: Never   Smokeless tobacco: Never  Vaping Use   Vaping status: Never Used  Substance and Sexual Activity   Alcohol use: Yes    Alcohol/week: 8.0 standard drinks of alcohol    Types: 2 Glasses of wine, 6 Cans of beer per week    Comment: occ   Drug use: Never   Sexual activity: Not Currently    Birth control/protection: None  Other Topics Concern   Not on file  Social History Narrative   Lives at home with wife.    Social Drivers of Corporate Investment Banker Strain: Low Risk  (09/18/2023)   Overall Financial Resource Strain (CARDIA)    Difficulty of Paying Living Expenses: Not hard at all  Food Insecurity: No Food Insecurity (09/18/2023)   Hunger Vital Sign    Worried About Running Out of Food in the Last Year: Never true    Ran Out of Food in the Last Year: Never true  Transportation Needs: No Transportation Needs (09/18/2023)   PRAPARE - Administrator, Civil Service (Medical): No    Lack of Transportation (Non-Medical): No  Physical Activity: Insufficiently Active (09/18/2023)   Exercise Vital Sign    Days of Exercise per Week: 1 day    Minutes of Exercise per Session: 30 min  Stress: No Stress Concern Present (09/18/2023)   [employer] of Occupational Health - Occupational Stress  Questionnaire    Feeling of Stress : Only a little  Social Connections: Moderately Isolated (09/18/2023)   Social Connection and Isolation Panel [NHANES]    Frequency of Communication with Friends and Family: Three times a week    Frequency of Social Gatherings with Friends and Family: Once a week    Attends Religious Services: Never    Database Administrator or Organizations: No    Attends Engineer, Structural: Not on file    Marital Status: Married     Allergies  Allergen Reactions   Aspirin Anaphylaxis    Throat swelling   Penicillins Anaphylaxis    Throat swelling   Sudafed [Pseudoephedrine] Anaphylaxis    Throat swelling   Codeine Nausea And Vomiting   Erythromycin Nausea And Vomiting     There is no immunization history on file for this patient.  Health Maintenance  Topic Date Due   HIV Screening  Never done   Hepatitis C Screening  Never done   DTaP/Tdap/Td (1 - Tdap) Never done   Colonoscopy  Never done   COVID-19 Vaccine (1 - 2024-25 season) Never done   MAMMOGRAM  05/16/2023   Zoster Vaccines- Shingrix (1 of 2) Never done   INFLUENZA VACCINE  12/07/2023  (Originally 04/09/2023)   Cervical Cancer Screening (HPV/Pap Cotest)  08/22/2025   HPV VACCINES  Aged Out    Patient Care Team: Sharma Coyer, MD as PCP - General (Family Medicine)  Review of Systems  Last CBC Lab Results  Component Value Date   WBC 10.3 05/29/2023   HGB 13.4 05/29/2023   HCT 40.4 05/29/2023   MCV 84.9 05/29/2023   MCH 28.2 05/29/2023   RDW 12.8 05/29/2023   PLT 259 05/29/2023   Last metabolic panel Lab Results  Component Value Date   GLUCOSE 180 (H) 05/29/2023   NA 140 05/29/2023   K 3.8 05/29/2023   CL 104 05/29/2023   CO2 25 05/29/2023   BUN 16 05/29/2023   CREATININE 0.86 05/29/2023   GFRNONAA >60 05/29/2023   CALCIUM 9.2 05/29/2023   PROT 7.3 05/29/2023   ALBUMIN 3.8 05/29/2023   BILITOT 0.8 05/29/2023   ALKPHOS 91 05/29/2023   AST 14 (L) 05/29/2023   ALT 18 05/29/2023   ANIONGAP 11 05/29/2023   Last hemoglobin A1c No results found for: HGBA1C Last thyroid functions Lab Results  Component Value Date   TSH 0.882 01/24/2021        Objective    BP 122/88 (Cuff Size: Large)   Pulse 99   Ht 5' 5 (1.651 m)   Wt 253 lb 9.6 oz (115 kg)   SpO2 100%   BMI 42.20 kg/m   BP Readings from Last 3 Encounters:  09/18/23 122/88  05/29/23 122/78  11/21/22 (!) 125/90   Wt Readings from Last 3 Encounters:  09/18/23 253 lb 9.6 oz (115 kg)  05/29/23 228 lb (103.4 kg)  12/04/21 260 lb (117.9 kg)        Depression Screen    09/18/2023   12:17 PM  PHQ 2/9 Scores  PHQ - 2 Score 1  PHQ- 9 Score 5   No results found for any visits on 09/18/23.   Physical Exam Vitals reviewed.  Constitutional:      General: She is not in acute distress.    Appearance: Normal appearance. She is not ill-appearing, toxic-appearing or diaphoretic.  Eyes:     Conjunctiva/sclera: Conjunctivae normal.  Cardiovascular:     Rate and Rhythm: Normal  rate and regular rhythm.     Pulses: Normal pulses.     Heart sounds: Normal heart sounds. No  murmur heard.    No friction rub. No gallop.     Comments: Varicose veins,>right LE than left  Pulmonary:     Effort: Pulmonary effort is normal. No respiratory distress.     Breath sounds: Normal breath sounds. No stridor. No wheezing, rhonchi or rales.  Abdominal:     General: Bowel sounds are normal. There is no distension.     Palpations: Abdomen is soft.     Tenderness: There is abdominal tenderness in the right lower quadrant and left lower quadrant.  Musculoskeletal:     Right lower leg: No edema.     Left lower leg: No edema.  Skin:    Findings: No erythema or rash.  Neurological:     Mental Status: She is alert and oriented to person, place, and time.        Assessment & Plan      Problem List Items Addressed This Visit       Cardiovascular and Mediastinum   Varicose veins of both legs with edema   Chronic Appears to be genetic component  Patient politely declines referral for vascular specialist at this time Will continue to monitor  Pt reports old medication for PRN lower extremity edema but is unsure of the name but reports using this agent due to intermittently lower extremity edema         Other   Elevated blood pressure reading   Blood pressure at 130/95 mmHg, typically lower. Possible stress-related elevation. Discussed potential impact of Adderall on blood pressure and need for monitoring. - Monitor blood pressure - Recheck blood pressure at next visit      Relevant Orders   Comprehensive metabolic panel   CBC with Differential/Platelet   TSH   Class 3 severe obesity due to excess calories without serious comorbidity with body mass index (BMI) of 40.0 to 44.9 in adult (HCC)   BMI of 42.2. Discussed family history and challenges with weight management. She expressed frustration with weight loss efforts and genetic predisposition. - Monitor weight and provide lifestyle modification support      Relevant Medications   amphetamine -dextroamphetamine   (ADDERALL XR) 10 MG 24 hr capsule   Other Relevant Orders   Comprehensive metabolic panel   Hemoglobin A1c   Attention deficit hyperactivity disorder (ADHD), predominantly inattentive type - Primary   Chronic, uncontrolled  Severe ADHD diagnosed in childhood. Previously managed with Adderall but has been off medication for four years. Reports significant impact on sleep and daily functioning. Discussed restarting Adderall at a low dose to manage symptoms. - Prescribe Adderall 10 mg once daily - Follow up in one month to assess response to medication -reviewed PDMP, no recent controlled substance prescriptions       Relevant Medications   amphetamine -dextroamphetamine  (ADDERALL XR) 10 MG 24 hr capsule   Alternating constipation and diarrhea   Chronic gastrointestinal symptoms including alternating constipation and diarrhea, abdominal cramps postprandially, and history of hemorrhoids. No gallbladder. Last colonoscopy and upper endoscopy in 2006. No family history of colon cancer but family history of gastrointestinal bleeding. Differential includes irritable bowel syndrome (IBS) and inflammatory bowel disease (IBD). Discussed the Shield blood test as an alternative to colonoscopy, but she prefers colonoscopy due to lack of insurance and potential need for follow-up if [facility] test is positive. - Refer to gastroenterologist for further evaluation and possible colonoscopy  Relevant Orders   Ambulatory referral to Gastroenterology   Other Visit Diagnoses       Establishing care with new doctor, encounter for         Encounter for screening mammogram for malignant neoplasm of breast       Relevant Orders   MM 3D SCREENING MAMMOGRAM BILATERAL BREAST     Encounter for screening for lipid disorder       Relevant Orders   Lipid Panel With LDL/HDL Ratio     Family history of thyroid disease       Relevant Orders   TSH     Family history of heart disease            General Health  Maintenance New patient visit for a 53 year old. Requests for routine screenings and lab work. Declined hepatitis C and HIV screening. - Order mammogram for breast cancer screening - Order colon cancer screening - Order complete metabolic panel - Order CBC - Order lipid panel - Order hemoglobin A1c for diabetes screening - Order TSH   Return in about 1 month (around 10/19/2023) for ADHD, HTN.      Rockie Agent, MD  [facility] [phone] (phone) [phone] (fax)  [facility]

## 2023-09-19 DIAGNOSIS — I83893 Varicose veins of bilateral lower extremities with other complications: Secondary | ICD-10-CM | POA: Insufficient documentation

## 2023-09-19 DIAGNOSIS — E66813 Obesity, class 3: Secondary | ICD-10-CM | POA: Insufficient documentation

## 2023-09-19 DIAGNOSIS — R030 Elevated blood-pressure reading, without diagnosis of hypertension: Secondary | ICD-10-CM | POA: Insufficient documentation

## 2023-09-19 LAB — CBC WITH DIFFERENTIAL/PLATELET
Basophils Absolute: 0 10*3/uL (ref 0.0–0.2)
Basos: 0 %
EOS (ABSOLUTE): 0.1 10*3/uL (ref 0.0–0.4)
Eos: 1 %
Hematocrit: 45.1 % (ref 34.0–46.6)
Hemoglobin: 14.5 g/dL (ref 11.1–15.9)
Immature Grans (Abs): 0 10*3/uL (ref 0.0–0.1)
Immature Granulocytes: 0 %
Lymphocytes Absolute: 1.9 10*3/uL (ref 0.7–3.1)
Lymphs: 20 %
MCH: 27.2 pg (ref 26.6–33.0)
MCHC: 32.2 g/dL (ref 31.5–35.7)
MCV: 85 fL (ref 79–97)
Monocytes Absolute: 0.5 10*3/uL (ref 0.1–0.9)
Monocytes: 6 %
Neutrophils Absolute: 7.1 10*3/uL — ABNORMAL HIGH (ref 1.4–7.0)
Neutrophils: 73 %
Platelets: 313 10*3/uL (ref 150–450)
RBC: 5.33 x10E6/uL — ABNORMAL HIGH (ref 3.77–5.28)
RDW: 12 % (ref 11.7–15.4)
WBC: 9.6 10*3/uL (ref 3.4–10.8)

## 2023-09-19 LAB — COMPREHENSIVE METABOLIC PANEL
ALT: 18 [IU]/L (ref 0–32)
AST: 13 [IU]/L (ref 0–40)
Albumin: 4.6 g/dL (ref 3.9–4.9)
Alkaline Phosphatase: 127 [IU]/L — ABNORMAL HIGH (ref 44–121)
BUN/Creatinine Ratio: 13 (ref 9–23)
BUN: 9 mg/dL (ref 6–24)
Bilirubin Total: 0.9 mg/dL (ref 0.0–1.2)
CO2: 23 mmol/L (ref 20–29)
Calcium: 10.1 mg/dL (ref 8.7–10.2)
Chloride: 99 mmol/L (ref 96–106)
Creatinine, Ser: 0.71 mg/dL (ref 0.57–1.00)
Globulin, Total: 2.5 g/dL (ref 1.5–4.5)
Glucose: 186 mg/dL — ABNORMAL HIGH (ref 70–99)
Potassium: 5 mmol/L (ref 3.5–5.2)
Sodium: 140 mmol/L (ref 134–144)
Total Protein: 7.1 g/dL (ref 6.0–8.5)
eGFR: 104 mL/min/{1.73_m2} (ref >59)

## 2023-09-19 LAB — LIPID PANEL WITH LDL/HDL RATIO
Cholesterol, Total: 178 mg/dL (ref 100–199)
HDL: 79 mg/dL (ref >39)
LDL Chol Calc (NIH): 82 mg/dL (ref 0–99)
LDL/HDL Ratio: 1 {ratio} (ref 0.0–3.2)
Triglycerides: 96 mg/dL (ref 0–149)
VLDL Cholesterol Cal: 17 mg/dL (ref 5–40)

## 2023-09-19 LAB — HEMOGLOBIN A1C
Est. average glucose Bld gHb Est-mCnc: 209 mg/dL
Hgb A1c MFr Bld: 8.9 % — ABNORMAL HIGH (ref 4.8–5.6)

## 2023-09-19 LAB — TSH: TSH: 0.595 u[IU]/mL (ref 0.450–4.500)

## 2023-09-19 NOTE — Assessment & Plan Note (Signed)
 BMI of 42.2. Discussed family history and challenges with weight management. She expressed frustration with weight loss efforts and genetic predisposition. - Monitor weight and provide lifestyle modification support

## 2023-09-19 NOTE — Assessment & Plan Note (Signed)
 Chronic Appears to be genetic component  Patient politely declines referral for vascular specialist at this time Will continue to monitor  Pt reports old medication for PRN lower extremity edema but is unsure of the name but reports using this agent due to intermittently lower extremity edema

## 2023-09-19 NOTE — Assessment & Plan Note (Signed)
 Chronic gastrointestinal symptoms including alternating constipation and diarrhea, abdominal cramps postprandially, and history of hemorrhoids. No gallbladder. Last colonoscopy and upper endoscopy in 2006. No family history of colon cancer but family history of gastrointestinal bleeding. Differential includes irritable bowel syndrome (IBS) and inflammatory bowel disease (IBD). Discussed the Shield blood test as an alternative to colonoscopy, but she prefers colonoscopy due to lack of insurance and potential need for follow-up if [facility] test is positive. - Refer to gastroenterologist for further evaluation and possible colonoscopy

## 2023-09-19 NOTE — Assessment & Plan Note (Signed)
 Chronic, uncontrolled  Severe ADHD diagnosed in childhood. Previously managed with Adderall but has been off medication for four years. Reports significant impact on sleep and daily functioning. Discussed restarting Adderall at a low dose to manage symptoms. - Prescribe Adderall 10 mg once daily - Follow up in one month to assess response to medication -reviewed PDMP, no recent controlled substance prescriptions

## 2023-09-19 NOTE — Assessment & Plan Note (Signed)
 Blood pressure at 130/95 mmHg, typically lower. Possible stress-related elevation. Discussed potential impact of Adderall on blood pressure and need for monitoring. - Monitor blood pressure - Recheck blood pressure at next visit

## 2023-09-21 ENCOUNTER — Encounter: Payer: Self-pay | Admitting: Family Medicine

## 2023-09-21 DIAGNOSIS — E1165 Type 2 diabetes mellitus with hyperglycemia: Secondary | ICD-10-CM | POA: Insufficient documentation

## 2023-09-21 MED ORDER — METFORMIN HCL ER 750 MG PO TB24
750.0000 mg | ORAL_TABLET | Freq: Every day | ORAL | 3 refills | Status: DC
Start: 2023-09-21 — End: 2024-07-05

## 2023-09-21 NOTE — Addendum Note (Signed)
 Addended by: Bing Neighbors on: 09/21/2023 05:13 PM   Modules accepted: Orders

## 2023-10-22 ENCOUNTER — Encounter: Payer: Self-pay | Admitting: Family Medicine

## 2023-10-22 DIAGNOSIS — F900 Attention-deficit hyperactivity disorder, predominantly inattentive type: Secondary | ICD-10-CM

## 2023-10-22 MED ORDER — AMPHETAMINE-DEXTROAMPHET ER 10 MG PO CP24
10.0000 mg | ORAL_CAPSULE | Freq: Every day | ORAL | 0 refills | Status: DC
Start: 2023-10-22 — End: 2023-10-26

## 2023-10-26 MED ORDER — AMPHETAMINE-DEXTROAMPHETAMINE 10 MG PO TABS
10.0000 mg | ORAL_TABLET | Freq: Every day | ORAL | 0 refills | Status: DC
Start: 2023-10-26 — End: 2023-10-29

## 2023-10-26 NOTE — Addendum Note (Signed)
Addended by: Bing Neighbors on: 10/26/2023 10:51 AM   Modules accepted: Orders

## 2023-10-29 ENCOUNTER — Telehealth: Payer: Self-pay | Admitting: Family Medicine

## 2023-10-29 ENCOUNTER — Encounter: Payer: Self-pay | Admitting: Family Medicine

## 2023-10-29 VITALS — BP 124/70

## 2023-10-29 DIAGNOSIS — Z7984 Long term (current) use of oral hypoglycemic drugs: Secondary | ICD-10-CM

## 2023-10-29 DIAGNOSIS — F900 Attention-deficit hyperactivity disorder, predominantly inattentive type: Secondary | ICD-10-CM

## 2023-10-29 DIAGNOSIS — E1165 Type 2 diabetes mellitus with hyperglycemia: Secondary | ICD-10-CM

## 2023-10-29 MED ORDER — GLIPIZIDE 2.5 MG PO TABS
2.5000 mg | ORAL_TABLET | Freq: Every day | ORAL | 3 refills | Status: DC
Start: 2023-10-29 — End: 2023-12-15

## 2023-10-29 MED ORDER — AMPHETAMINE-DEXTROAMPHET ER 20 MG PO CP24
20.0000 mg | ORAL_CAPSULE | Freq: Every day | ORAL | 0 refills | Status: DC
Start: 2023-10-29 — End: 2023-12-10

## 2023-10-29 NOTE — Patient Instructions (Signed)
VISIT SUMMARY:  Today, we discussed the management of your ADHD, diabetes, hypertension, obesity, and gastrointestinal symptoms. We reviewed your current medications and made some adjustments to better control your symptoms and improve your overall health. We also talked about the importance of regular screenings and preventive care, especially for diabetes-related complications.  YOUR PLAN:  -TYPE 2 DIABETES MELLITUS: Type 2 diabetes is a condition where your body does not use insulin properly, leading to high blood sugar levels. Your blood sugar levels are stabilizing, and we will continue Metformin 750 mg ER once daily. We are starting Mounjaro 2.5 mg once weekly for 4 weeks, then 5 mg once weekly for 4 weeks. If injections are not feasible, we may consider Rybelsus. We also discussed Jardiance or Farxiga for additional control and renal protection. You will start Glipizide 2.5 mg once daily. We will recheck your A1c in early May and refer you to a diabetes educator and nutritionist.   -ATTENTION DEFICIT HYPERACTIVITY DISORDER (ADHD): ADHD is a condition that affects focus and behavior. You have noticed improvement with Adderall, but the 10 mg dose wears off by early afternoon. We will increase your dose to Adderall 20 mg XR once daily and monitor its effectiveness and any side effects.   -GENERAL HEALTH MAINTENANCE: Regular screenings and preventive care are important, especially for diabetes. Ensure you have an annual eye exam for diabetic retinopathy, a foot exam for neuropathy and ulcers, and a urine albumin check for kidney function.  INSTRUCTIONS:  Follow up in early May for an A1c recheck and medication review. Continue to monitor your blood glucose levels and report any significant changes or hypoglycemic episodes. Contact your provider if you experience any adverse effects from the new medications.

## 2023-10-29 NOTE — Progress Notes (Signed)
MyChart Video Visit    Virtual Visit via Video Note   This format is felt to be most appropriate for this patient at this time. Physical exam was limited by quality of the video and audio technology used for the visit.   Patient location: Patient's home address   Provider location: [facility]  [address]  [place], [place] [address]   I discussed the limitations of evaluation and management by telemedicine and the availability of in person appointments. The patient expressed understanding and agreed to proceed.  Patient: Dana Randolph   DOB: 07/30/1971   53 y.o. Female  MRN: [id number] Visit Date: 10/29/2023  Today's healthcare provider: Ronnald Ramp, MD   Chief Complaint  Patient presents with   ADHD   Hypertension   Diabetes Management Plan   Subjective    Hypertension     Discussed the use of AI scribe software for clinical note transcription with the patient, who gave verbal consent to proceed.  History of Present Illness   Dana Randolph is a 53 year old female with ADHD, diabetes, and hypertension who presents for follow-up.  She is following up for ADHD management. She was last seen on January 10th, when she was started on Adderall 10 mg once daily. She notes improvement in mood swings and hyperactivity but finds the medication wears off by lunchtime. She recalls previously being on Adderall 20 mg XR, which was more effective.  Regarding diabetes, she was recently diagnosed and started on metformin 750 mg extended release once daily. Initially, her blood sugar levels were erratic, ranging from 40 to nearly 300. Over the past week, levels have stabilized between 100 and 160, with occasional spikes up to 190. She has rejoined [employer] and is working with a coach to adjust her diet. Her constipation has resolved since starting metformin and changing her diet.  Her hypertension was previously recorded at 130/95 but has  improved to 122/88. She has been monitoring her blood pressure at home, with recent readings around 124/70, which she notes is higher than her usual 90/60.  She has a history of obesity with a BMI of 42.2. She is working on lifestyle modifications to address her obesity and diabetes.  She was referred to gastroenterology for alternating constipation and diarrhea.       Past Medical History:  Diagnosis Date   Allergy    Bronchitis    Pneumonia     Medications: Outpatient Medications Prior to Visit  Medication Sig   metFORMIN (GLUCOPHAGE-XR) 750 MG 24 hr tablet Take 1 tablet (750 mg total) by mouth daily with breakfast.   [DISCONTINUED] acetaminophen (TYLENOL) 500 MG tablet Take 1,000 mg by mouth every 8 (eight) hours as needed for moderate pain (pain score 4-6). (Patient not taking: Reported on 09/18/2023)   [DISCONTINUED] amphetamine-dextroamphetamine (ADDERALL) 10 MG tablet Take 1 tablet (10 mg total) by mouth daily with breakfast.   No facility-administered medications prior to visit.    Review of Systems  Last metabolic panel Lab Results  Component Value Date   GLUCOSE 186 (H) 09/18/2023   NA 140 09/18/2023   K 5.0 09/18/2023   CL 99 09/18/2023   CO2 23 09/18/2023   BUN 9 09/18/2023   CREATININE 0.71 09/18/2023   EGFR 104 09/18/2023   CALCIUM 10.1 09/18/2023   PROT 7.1 09/18/2023   ALBUMIN 4.6 09/18/2023   LABGLOB 2.5 09/18/2023   BILITOT 0.9 09/18/2023   ALKPHOS 127 (H) 09/18/2023   AST  13 09/18/2023   ALT 18 09/18/2023   ANIONGAP 11 05/29/2023   Last lipids Lab Results  Component Value Date   CHOL 178 09/18/2023   HDL 79 09/18/2023   LDLCALC 82 09/18/2023   TRIG 96 09/18/2023   Last hemoglobin A1c Lab Results  Component Value Date   HGBA1C 8.9 (H) 09/18/2023        Objective    BP 124/70 Comment: home BP measurement  BP Readings from Last 3 Encounters:  10/29/23 124/70  09/18/23 122/88  05/29/23 122/78   Wt Readings from Last 3 Encounters:   09/18/23 253 lb 9.6 oz (115 kg)  05/29/23 228 lb (103.4 kg)  12/04/21 260 lb (117.9 kg)        Physical Exam Constitutional:      General: She is not in acute distress.    Appearance: Normal appearance. She is not ill-appearing.  Pulmonary:     Effort: Pulmonary effort is normal. No respiratory distress.  Neurological:     Mental Status: She is alert and oriented to person, place, and time.        Assessment & Plan     Problem List Items Addressed This Visit       Endocrine   Type 2 diabetes mellitus with hyperglycemia, without long-term current use of insulin (HCC) - Primary   Relevant Medications   glipiZIDE 2.5 MG TABS   Other Relevant Orders   AMB Referral [facility] Care Management     Other   Attention deficit hyperactivity disorder (ADHD), predominantly inattentive type   Relevant Medications   amphetamine-dextroamphetamine (ADDERALL XR) 20 MG 24 hr capsule       Type 2 Diabetes Mellitus   Newly diagnosed with an initial A1c of 8.9%. Blood glucose levels now stabilizing between 100-160 mg/dL. Discussed glycemic control to prevent complications (nephropathy, retinopathy, neuropathy), lifestyle modifications, and medication options. Explained GLP-1 medications (Ozempic, Mounjaro) for glycemic control and weight loss, noting potential insurance issues. Discussed alternatives (Rybelsus, Jardiance, Farxiga) and their side effects (UTIs, perineal infections). Emphasized regular screenings for complications.   - Continue Metformin 750 mg ER once daily   - Start Mounjaro 2.5 mg once weekly for 4 weeks, then 5 mg once weekly for 4 weeks if we can get assistance with coverage, pt is not currently insured   - Refer to [facility] for interdisciplinary support , pharmacy request for pt med assistance  - Consider Rybelsus if injections are not feasible   - Consider Jardiance or Marcelline Deist for additional glycemic control and renal protection depending on option for med  assistance with cost  - Start Glipizide 2.5 mg once daily   - Recheck A1c in early May 2025 - pt will continue to work with [employer]  - will need DM FOOT exam, eye exam, low dose ace/arb and statin for GDMT  Elevated BP, resolved Well-managed with current readings of 124/70 mmHg. Discussed importance of blood pressure control in diabetes to reduce cardiovascular risk.  BP - Monitor blood pressure regularly   - Consider low-dose Lisinopril for renal protection in future visits    Obesity   BMI of 42.2. Discussed weight loss for diabetes management and overall health. Rejoined [employer] and receiving nutritional guidance. Explained weight loss benefits for glycemic control and medication reduction.   - Continue lifestyle modifications and [employer] program   - Monitor weight and BMI regularly    Attention Deficit Hyperactivity Disorder (ADHD)   Managed with Adderall. Reports  improvement in mood and focus, but 10 mg dose wears off by early afternoon. Discussed increasing to 20 mg XR for better symptom control.   - Increase Adderall to 20 mg XR once daily   - Monitor effectiveness and side effects    Gastrointestinal Symptoms (Alternating Constipation and Diarrhea)   Improvement in constipation with dietary changes and Metformin. No worsening of symptoms noted.   - Continue current dietary modifications   - Follow up with gastroenterology as needed     General Health Maintenance   Discussed importance of regular screenings and preventive care in diabetes.   - Annual ophthalmology exam for diabetic retinopathy   - Annual foot exam for neuropathy and ulcers   - Annual urine albumin check for kidney function    Follow-up   - Follow up in early May for A1c recheck and medication review   - Monitor blood glucose levels and report significant changes or hypoglycemic episodes   - Contact provider if experiencing adverse effects from new medications.          No follow-ups on file.     I discussed the assessment and treatment plan with the patient. The patient was provided an opportunity to ask questions and all were answered. The patient agreed with the plan and demonstrated an understanding of the instructions.   The patient was advised to call back or seek an in-person evaluation if the symptoms worsen or if the condition fails to improve as anticipated.  I provided 37 minutes of non-face-to-face time during this encounter.   Ronnald Ramp, MD [facility] [phone] (phone) [phone] (fax)   [facility]

## 2023-11-03 ENCOUNTER — Other Ambulatory Visit: Payer: Self-pay

## 2023-11-03 ENCOUNTER — Other Ambulatory Visit: Payer: Self-pay | Admitting: Pharmacist

## 2023-11-03 DIAGNOSIS — E1165 Type 2 diabetes mellitus with hyperglycemia: Secondary | ICD-10-CM

## 2023-11-03 MED ORDER — TRULICITY 0.75 MG/0.5ML ~~LOC~~ SOAJ
0.7500 mg | SUBCUTANEOUS | 0 refills | Status: DC
Start: 2023-11-03 — End: 2023-12-15
  Filled 2023-11-03: qty 2, 28d supply, fill #0

## 2023-11-03 NOTE — Progress Notes (Signed)
 11/03/2023 Name: Dana Randolph MRN: [id number] DOB: [birth year]  Chief Complaint  Patient presents with   Diabetes    Dana Randolph is a 53 y.o. year old female who presented for a telephone visit.   They were referred to the pharmacist by their PCP for assistance in managing diabetes.    Subjective:  Care Team: Primary Care Provider: Ronnald Ramp, MD ; Next Scheduled Visit: 01/07/24 Clinical Pharmacist: Marlowe Aschoff, PharmD  Medication Access/Adherence  [phone] [address], [place] - [address] [place] [address] Phone: [phone] Fax: [phone]  [facility] [address] [place] [address] Phone: [phone] Fax: [phone]   Patient reports affordability concerns with their medications: No  Patient reports access/transportation concerns to their pharmacy: No  Patient reports adherence concerns with their medications:  No     Diabetes:  Current medications: Metformin ER 750mg  daily, Glipizide XL 2.5mg  daily Medications tried in the past: None  Current glucose readings:  low 100's to 250's Using Amazon meter; testing 2x times daily   Patient denies hypoglycemic s/sx including dizziness, shakiness, sweating. Patient denies hyperglycemic symptoms including polyuria, polydipsia, polyphagia, nocturia, neuropathy, blurred vision.  Current meal patterns:  - Breakfast: Two eggs, piece of [place] bacon, eggs/mushroom/spinach, protein shake - Lunch: Grilled chicken, shrimp (meal prep), vegetable, chicken Caesar - Supper: Taco bowls with ground [place], brown rice. 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 support: No insurance due to small company  Follow-up calls any day EXCEPT Tuesday before  lunch  Objective:  Lab Results  Component Value Date   HGBA1C 8.9 (H) 09/18/2023    Lab Results  Component Value Date   CREATININE 0.71 09/18/2023   BUN 9 09/18/2023   NA 140 09/18/2023   K 5.0 09/18/2023   CL 99 09/18/2023   CO2 23 09/18/2023    Lab Results  Component Value Date   CHOL 178 09/18/2023   HDL 79 09/18/2023   LDLCALC 82 09/18/2023   TRIG 96 09/18/2023    Medications Reviewed Today     Reviewed by Pollie Friar, RPH (Pharmacist) on 11/03/23 at 1527  Med List Status: <None>   Medication Order Taking? Sig Documenting Provider Last Dose Status Informant  amphetamine-dextroamphetamine (ADDERALL XR) 20 MG 24 hr capsule [id number] Yes Take 1 capsule (20 mg total) by mouth daily. Simmons-Robinson, Makiera, MD Taking Active   Dulaglutide (TRULICITY) 0.75 MG/0.5ML Ivory Broad [id number] Yes Inject 0.75 mg into the skin once a week. Simmons-Robinson, Tawanna Cooler, MD Taking Active   glipiZIDE 2.5 MG TABS [id number] Yes Take 2.5 mg by mouth daily. Simmons-Robinson, Makiera, MD Taking Active   metFORMIN (GLUCOPHAGE-XR) 750 MG 24 hr tablet [id number] Yes Take 1 tablet (750 mg total) by mouth daily with breakfast. Simmons-Robinson, Makiera, MD Taking Active               Assessment/Plan:   Diabetes: - Currently uncontrolled - Reviewed long term cardiovascular and renal outcomes of uncontrolled blood sugar - Reviewed goal A1c, goal fasting, and goal 2 hour post prandial glucose - Reviewed dietary modifications including removing carbs and sugars - Recommend to   - Patient denies personal or family history of multiple endocrine neoplasia type 2, medullary thyroid cancer; personal history of pancreatitis or gallbladder disease. - Recommend to check glucose twice daily- daily  for the future - Does NOT meet PAP qualifications for Trulicity or Ozempic   Follow Up Plan:  - Follow-up on Thursday at 3:30PM to see if patient was able to get the medication and start - Send Trulicity  Rx to [facility]- has no insurance - Patient aware she will have to send a form at pick-up for the program  - If not able to get from [facility], patient can complete Metformin titration + Glipizide XL dosing; Nino Parsley is also $50 for 1 month supply at [facility] pharmacy online + $5 shipping fee   Marlowe Aschoff, PharmD [facility] Phone Number: [phone]

## 2023-11-05 ENCOUNTER — Other Ambulatory Visit: Payer: Self-pay

## 2023-11-06 ENCOUNTER — Telehealth: Payer: Self-pay | Admitting: Pharmacist

## 2023-11-06 NOTE — Progress Notes (Signed)
   11/06/2023  Patient ID: Dana Randolph, female   DOB: [birth year], 53 y.o.   MRN: [id number]  Called to follow-up with the patient today regarding Trulicity. She was able to get it from [facility] and did her first injection yesterday. Said it kept her up all night long due to nausea from taking it.   Advised I will ask Dr. Neita Garnet about sending in Zofran to help with the nausea currently. Will hopefully go away after a few days and get better after the next injection. Follow-up call set to see how she is doing prior to the third injection.  Sending request to PCP for Zofran to assist for now.    Marlowe Aschoff, PharmD [facility] Phone Number: [phone]

## 2023-11-09 MED ORDER — ONDANSETRON HCL 4 MG PO TABS: 4.0000 mg | ORAL_TABLET | Freq: Three times a day (TID) | ORAL | 1 refills | Status: DC | PRN

## 2023-11-17 ENCOUNTER — Other Ambulatory Visit: Payer: Self-pay | Admitting: Pharmacist

## 2023-11-17 DIAGNOSIS — E1165 Type 2 diabetes mellitus with hyperglycemia: Secondary | ICD-10-CM

## 2023-11-17 NOTE — Progress Notes (Addendum)
   11/17/2023 Name: Dana Randolph MRN: [id number] DOB: 07-Dec-1970  Chief Complaint  Patient presents with   Diabetes    Dana Randolph is a 53 y.o. year old female who presented for a telephone visit.   They were referred to the pharmacist by their PCP for assistance in managing diabetes.    Subjective:  Care Team: Primary Care Provider: Ronnald Ramp, MD ; Next Scheduled Visit: 01/07/24 Clinical Pharmacist: Marlowe Aschoff, PharmD  Medication Access/Adherence  [facility]  [facility] [address] [place] - [address] [place] [address] Phone: [phone] Fax: [phone]  [facility] - [facility] [address] [place] [address] Phone: [phone] Fax: [phone]   Patient reports affordability concerns with their medications: No  Patient reports access/transportation concerns to their pharmacy: No  Patient reports adherence concerns with their medications:  No     Diabetes:  Current medications: Metformin ER 750mg  daily, Glipizide 2.5mg  daily, Trulicity 0.75mg 0.72mL weekly Medications tried in the past: None  Current glucose readings from 3/11 visit: 100-120's Current glucose readings from 2/25:  low 100's to 250's Using Amazon meter; testing 2x times daily   Patient denies hypoglycemic s/sx including dizziness, shakiness, sweating. Patient denies hyperglycemic symptoms including polyuria, polydipsia, polyphagia, nocturia, neuropathy, blurred vision.  Current meal patterns:  - Breakfast: Two eggs, piece of [place] bacon, eggs/mushroom/spinach, protein shake - Lunch: Grilled chicken, shrimp (meal prep), vegetable, chicken Caesar - Supper: Taco bowls with ground [place], brown rice. 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 support:  No insurance due to small company  Follow-up calls any day EXCEPT Tuesday before lunch  Objective:  Lab Results  Component Value Date   HGBA1C 8.9 (H) 09/18/2023    Lab Results  Component Value Date   CREATININE 0.71 09/18/2023   BUN 9 09/18/2023   NA 140 09/18/2023   K 5.0 09/18/2023   CL 99 09/18/2023   CO2 23 09/18/2023    Lab Results  Component Value Date   CHOL 178 09/18/2023   HDL 79 09/18/2023   LDLCALC 82 09/18/2023   TRIG 96 09/18/2023    Medications Reviewed Today   Medications were not reviewed in this encounter       Assessment/Plan:   Diabetes: - Currently uncontrolled - Reviewed long term cardiovascular and renal outcomes of uncontrolled blood sugar - Reviewed goal A1c, goal fasting, and goal 2 hour post prandial glucose - Reviewed dietary modifications including removing carbs and sugars - Recommend to   - Patient denies personal or family history of multiple endocrine neoplasia type 2, medullary thyroid cancer; personal history of pancreatitis or gallbladder disease. - Recommend to check glucose twice daily- daily for the future - Does NOT meet PAP qualifications for Trulicity or Ozempic   Follow Up Plan:  - Follow-up on 12/15/23 to see how next dose of Trulicity 1.5mg  weekly is going- will need Zofran script too (will have completed 2nd dose of 1.5mg  weekly) - Send Trulicity 1.5mg  [facility]- has no insurance - Patient aware she will have to send a form at pick-up for the program - Remove Glipizide at next call!   Marlowe Aschoff, PharmD [facility] Phone Number: [phone]

## 2023-11-20 ENCOUNTER — Other Ambulatory Visit: Payer: Self-pay

## 2023-11-20 MED ORDER — TRULICITY 1.5 MG/0.5ML ~~LOC~~ SOAJ
1.5000 mg | SUBCUTANEOUS | 0 refills | Status: DC
Start: 2023-11-20 — End: 2023-12-30
  Filled 2023-11-20 – 2023-12-07 (×2): qty 2, 28d supply, fill #0

## 2023-11-20 NOTE — Addendum Note (Signed)
 Addended by: Linna Darner on: 11/20/2023 02:57 PM   Modules accepted: Orders

## 2023-12-01 ENCOUNTER — Other Ambulatory Visit: Payer: Self-pay

## 2023-12-07 ENCOUNTER — Other Ambulatory Visit: Payer: Self-pay

## 2023-12-10 ENCOUNTER — Encounter: Payer: Self-pay | Admitting: Family Medicine

## 2023-12-10 DIAGNOSIS — F900 Attention-deficit hyperactivity disorder, predominantly inattentive type: Secondary | ICD-10-CM

## 2023-12-11 MED ORDER — AMPHETAMINE-DEXTROAMPHET ER 20 MG PO CP24
20.0000 mg | ORAL_CAPSULE | Freq: Every day | ORAL | 0 refills | Status: DC
Start: 2023-12-11 — End: 2024-01-11

## 2023-12-15 ENCOUNTER — Other Ambulatory Visit: Payer: Self-pay | Admitting: Pharmacist

## 2023-12-15 DIAGNOSIS — E1165 Type 2 diabetes mellitus with hyperglycemia: Secondary | ICD-10-CM

## 2023-12-15 NOTE — Progress Notes (Signed)
12/15/2023 Name: Dana Randolph MRN: [id number] DOB: 01/06/1971  Chief Complaint  Patient presents with   Diabetes    Dana Randolph is a 53 y.o. year old female who presented for a telephone visit.   They were referred to the pharmacist by their PCP for assistance in managing diabetes.    Subjective:  Care Team: Primary Care Provider: Ronnald Ramp, MD ; Next Scheduled Visit: 01/07/24 Clinical Pharmacist: Marlowe Aschoff, PharmD  Medication Access/Adherence  [facility]  [facility] [address] [place] - [address] [place] [address] Phone: [phone] Fax: [phone]  [facility] - [facility] [address] [place] [address] Phone: [phone] Fax: [phone]   Patient reports affordability concerns with their medications: No  Patient reports access/transportation concerns to their pharmacy: No  Patient reports adherence concerns with their medications:  No     Diabetes:  Current medications: Metformin ER 750mg  daily, Glipizide 2.5mg  daily, Trulicity 1.5 mg weekly Medications tried in the past: None  Current glucose readings from 12/15/23: 100-115  Current glucose readings from 3/11 visit: 100-120's Current glucose readings from 2/25:  low 100's to 250's Using Amazon meter; testing 2x times daily   Patient denies hypoglycemic s/sx including dizziness, shakiness, sweating. Patient denies hyperglycemic symptoms including polyuria, polydipsia, polyphagia, nocturia, neuropathy, blurred vision.  Current meal patterns:  - Breakfast: Two eggs, piece of [place] bacon, eggs/mushroom/spinach, protein shake - Lunch: Grilled chicken, shrimp (meal prep), vegetable, chicken Caesar - Supper: Taco bowls with ground [place], brown rice. 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 support: No insurance due to small company  Follow-up calls any day EXCEPT Tuesday before lunch  Objective:  Lab Results  Component Value Date   HGBA1C 8.9 (H) 09/18/2023    Lab Results  Component Value Date   CREATININE 0.71 09/18/2023   BUN 9 09/18/2023   NA 140 09/18/2023   K 5.0 09/18/2023   CL 99 09/18/2023   CO2 23 09/18/2023    Lab Results  Component Value Date   CHOL 178 09/18/2023   HDL 79 09/18/2023   LDLCALC 82 09/18/2023   TRIG 96 09/18/2023    Medications Reviewed Today   Medications were not reviewed in this encounter       Assessment/Plan:   Diabetes: - Currently uncontrolled - Reviewed long term cardiovascular and renal outcomes of uncontrolled blood sugar - Reviewed goal A1c, goal fasting, and goal 2 hour post prandial glucose - Reviewed dietary modifications including removing carbs and sugars - Patient denies personal or family history of multiple endocrine neoplasia type 2, medullary thyroid cancer; personal history of pancreatitis or gallbladder disease. - Recommend to check glucose twice daily- daily for the future - Does NOT meet PAP qualifications for Trulicity or Ozempic   Follow Up Plan:  - Follow-up on 01/28/24 to see how A1c looks and BG readings are running - Send Trulicity 1.5mg  [facility]- has no insurance - Patient aware she will have to send a form at pick-up for the program - STOP Glipizide daily  *Of note, readings have only been within range for about 2 months, so A1c will be higher than current readings reflect- advised to bring written readings to next PCP visit   Marlowe Aschoff, PharmD [facility] Phone Number:  [phone]    

## 2023-12-30 ENCOUNTER — Other Ambulatory Visit: Payer: Self-pay

## 2023-12-30 MED ORDER — TRULICITY 1.5 MG/0.5ML ~~LOC~~ SOAJ
1.5000 mg | SUBCUTANEOUS | 0 refills | Status: DC
Start: 2023-12-30 — End: 2024-01-28
  Filled 2023-12-30 – 2024-01-01 (×2): qty 2, 28d supply, fill #0

## 2023-12-30 NOTE — Addendum Note (Signed)
 Addended by: Kira Hartl M on: 12/30/2023 09:34 AM   Modules accepted: Orders

## 2024-01-01 ENCOUNTER — Other Ambulatory Visit: Payer: Self-pay

## 2024-01-01 ENCOUNTER — Telehealth: Payer: Self-pay | Admitting: Pharmacist

## 2024-01-01 NOTE — Progress Notes (Signed)
   01/01/2024  Patient ID: Dana Randolph, female   DOB: Jan 31, 1971, 53 y.o.   MRN: [id number]  Received notification from the pharmacy that patient's income is too high to qualify for [facility]. Will not be able to fill the Trulicity  going forward.  Called patient to inform and discuss alternatives options.  Frustration with healthcare system and declined to add any medications at this time. Will focus on diet and exercise for now.  Options for future: - Increase Metformin  - Add Glipizide  XL - Add Brenzaavy for $50 monthly from [facility] pharmacy  *Does not qualify for any patient assistance due to combined income and has no insurance currently   Delvin File, PharmD [facility]  Phone Number: [phone]

## 2024-01-07 ENCOUNTER — Ambulatory Visit: Payer: Self-pay | Admitting: Family Medicine

## 2024-01-11 ENCOUNTER — Other Ambulatory Visit: Payer: Self-pay | Admitting: Family Medicine

## 2024-01-11 DIAGNOSIS — F900 Attention-deficit hyperactivity disorder, predominantly inattentive type: Secondary | ICD-10-CM

## 2024-01-11 MED ORDER — AMPHETAMINE-DEXTROAMPHET ER 20 MG PO CP24
20.0000 mg | ORAL_CAPSULE | Freq: Every day | ORAL | 0 refills | Status: DC
Start: 2024-02-11 — End: 2024-04-22

## 2024-01-11 MED ORDER — AMPHETAMINE-DEXTROAMPHET ER 20 MG PO CP24
20.0000 mg | ORAL_CAPSULE | Freq: Every day | ORAL | 0 refills | Status: DC
Start: 2024-03-10 — End: 2024-04-22

## 2024-01-11 MED ORDER — AMPHETAMINE-DEXTROAMPHET ER 20 MG PO CP24
20.0000 mg | ORAL_CAPSULE | Freq: Every day | ORAL | 0 refills | Status: DC
Start: 2024-01-11 — End: 2024-04-22

## 2024-01-11 NOTE — Telephone Encounter (Signed)
 LOV 1*10*25 NOV 5*22*25 LRF 4*4*25 LABS 1*10*25

## 2024-01-12 ENCOUNTER — Telehealth: Payer: Self-pay | Admitting: Family Medicine

## 2024-01-12 DIAGNOSIS — H10029 Other mucopurulent conjunctivitis, unspecified eye: Secondary | ICD-10-CM

## 2024-01-12 MED ORDER — POLYMYXIN B-TRIMETHOPRIM 10000-0.1 UNIT/ML-% OP SOLN
1.0000 [drp] | Freq: Four times a day (QID) | OPHTHALMIC | 0 refills | Status: DC
Start: 2024-01-12 — End: 2024-04-22

## 2024-01-12 NOTE — Progress Notes (Signed)
 E-Visit for Pink Eye ? ? ?We are sorry that you are not feeling well.  Here is how we plan to help! ? ?Based on what you have shared with me it looks like you have conjunctivitis.  Conjunctivitis is a common inflammatory or infectious condition of the eye that is often referred to as "pink eye".  In most cases it is contagious (viral or bacterial). However, not all conjunctivitis requires antibiotics (ex. Allergic).  We have made appropriate suggestions for you based upon your presentation. ? ?I have prescribed Polytrim Ophthalmic drops 1-2 drops 4 times a day times 5 days ? ?Pink eye can be highly contagious.  It is typically spread through direct contact with secretions, or contaminated objects or surfaces that one may have touched.  Strict handwashing is suggested with soap and water is urged.  If not available, use alcohol based had sanitizer.  Avoid unnecessary touching of the eye.  If you wear contact lenses, you will need to refrain from wearing them until you see no white discharge from the eye for at least 24 hours after being on medication.  You should see symptom improvement in 1-2 days after starting the medication regimen.  Call us if symptoms are not improved in 1-2 days. ? ?Home Care: ?Wash your hands often! ?Do not wear your contacts until you complete your treatment plan. ?Avoid sharing towels, bed linen, personal items with a person who has pink eye. ?See attention for anyone in your home with similar symptoms. ? ?Get Help Right Away If: ?Your symptoms do not improve. ?You develop blurred or loss of vision. ?Your symptoms worsen (increased discharge, pain or redness) ? ? ?Thank you for choosing an e-visit. ? ?Your e-visit answers were reviewed by a board certified advanced clinical practitioner to complete your personal care plan. Depending upon the condition, your plan could have included both over the counter or prescription medications. ? ?Please review your pharmacy choice. Make sure the  pharmacy is open so you can pick up prescription now. If there is a problem, you may contact your provider through [employer] and have the prescription routed to another pharmacy.  Your safety is important to [place]. If you have drug allergies check your prescription carefully.  ? ?For the next 24 hours you can use MyChart to ask questions about today's visit, request a non-urgent call back, or ask for a work or school excuse. ?You will get an email in the next two days asking about your experience. I hope that your e-visit has been valuable and will speed your recovery. ? ?I provided 5 minutes of non face-to-face time during this encounter for chart review, medication and order placement, as well as and documentation.  ? ?

## 2024-01-21 ENCOUNTER — Other Ambulatory Visit: Payer: Self-pay | Admitting: Medical Genetics

## 2024-01-28 ENCOUNTER — Encounter: Payer: Self-pay | Admitting: Family Medicine

## 2024-01-28 ENCOUNTER — Other Ambulatory Visit: Payer: Self-pay

## 2024-01-28 ENCOUNTER — Other Ambulatory Visit: Payer: Self-pay | Admitting: Pharmacist

## 2024-01-28 ENCOUNTER — Ambulatory Visit (INDEPENDENT_AMBULATORY_CARE_PROVIDER_SITE_OTHER): Payer: Self-pay | Admitting: Family Medicine

## 2024-01-28 VITALS — BP 114/81 | HR 96 | Ht 65.0 in | Wt 236.0 lb

## 2024-01-28 DIAGNOSIS — N951 Menopausal and female climacteric states: Secondary | ICD-10-CM

## 2024-01-28 DIAGNOSIS — F900 Attention-deficit hyperactivity disorder, predominantly inattentive type: Secondary | ICD-10-CM

## 2024-01-28 DIAGNOSIS — E1165 Type 2 diabetes mellitus with hyperglycemia: Secondary | ICD-10-CM

## 2024-01-28 MED ORDER — AMPHETAMINE-DEXTROAMPHET ER 10 MG PO CP24
10.0000 mg | ORAL_CAPSULE | Freq: Every day | ORAL | 0 refills | Status: DC
Start: 2024-01-28 — End: 2024-04-22

## 2024-01-28 MED ORDER — TRULICITY 1.5 MG/0.5ML ~~LOC~~ SOAJ
1.5000 mg | SUBCUTANEOUS | 3 refills | Status: AC
  Filled 2024-01-28: qty 2, 28d supply, fill #0
  Filled 2024-03-07 – 2024-03-08 (×2): qty 2, 28d supply, fill #1
  Filled 2024-04-07 (×2): qty 2, 28d supply, fill #2
  Filled 2024-05-17 – 2024-06-03 (×3): qty 2, 28d supply, fill #3
  Filled 2024-06-28 – 2024-07-29 (×4): qty 2, 28d supply, fill #4
  Filled 2024-08-25 – 2024-10-08 (×4): qty 2, 28d supply, fill #5

## 2024-01-28 NOTE — Progress Notes (Signed)
 Established patient visit   Patient: Dana Randolph   DOB: 1971/01/12   52 y.o. Female  MRN: [id number] Visit Date: 01/28/2024  Today's healthcare provider: Mimi Alt, MD   Chief Complaint  Patient presents with   Diabetes   Medication Refill    Med refill   Subjective     HPI     Medication Refill    Additional comments: Med refill      Last edited by Bart Lieu, CMA on 01/28/2024  8:35 AM.       Discussed the use of AI scribe software for clinical note transcription with the patient, who gave verbal consent to proceed.  History of Present Illness Dana Randolph is a 53 year old female with type two diabetes who presents for follow-up on diabetes management.  She is managing her type two diabetes with metformin  750 mg daily and Trulicity  1.5 mg weekly, although she is unable to afford continuing Trulicity . Her last hemoglobin A1c in January was 8.9. She has lost 18 pounds since her last visit, going from 253 pounds in January to 236 pounds today. She is focusing on diet and exercise to manage her glucose levels.  She is also being managed for ADHD, which is chronic and stable. She continues to take Adderall 20 mg daily, sometimes using a lower dose of 10 mg on weekends or when she does not need the extended release effect.  She experiences symptoms related to menopause, including hot flashes, leg cramps, body aches, and persistent menstrual bleeding despite a prior ablation procedure. The leg cramps are described as 'brutal' and wake her from sleep. She has tried melatonin gummies for sleep, which work for a few days, but her insomnia persists. No issues with low iron levels.  Her busy schedule limits her ability to exercise, although she is considering a walking pad for her office. She drinks water, coffee, and occasionally beer, but rarely consumes soda. She has a family history of diabetes, which has significantly impacted her family  members.     Past Medical History:  Diagnosis Date   Allergy    Bronchitis    Pneumonia     Medications: Outpatient Medications Prior to Visit  Medication Sig   amphetamine -dextroamphetamine  (ADDERALL XR) 20 MG 24 hr capsule Take 1 capsule (20 mg total) by mouth daily.   [START ON 02/11/2024] amphetamine -dextroamphetamine  (ADDERALL XR) 20 MG 24 hr capsule Take 1 capsule (20 mg total) by mouth daily.   [START ON 03/10/2024] amphetamine -dextroamphetamine  (ADDERALL XR) 20 MG 24 hr capsule Take 1 capsule (20 mg total) by mouth daily.   metFORMIN  (GLUCOPHAGE -XR) 750 MG 24 hr tablet Take 1 tablet (750 mg total) by mouth daily with breakfast.   trimethoprim -polymyxin b  (POLYTRIM ) ophthalmic solution Place 1 drop into the right eye in the morning, at noon, in the evening, and at bedtime. (Patient not taking: Reported on 01/28/2024)   [DISCONTINUED] Dulaglutide  (TRULICITY ) 1.5 MG/0.5ML SOAJ Inject 1.5 mg into the skin once a week. (Patient not taking: Reported on 01/28/2024)   No facility-administered medications prior to visit.    Review of Systems  Last metabolic panel Lab Results  Component Value Date   GLUCOSE 186 (H) 09/18/2023   NA 140 09/18/2023   K 5.0 09/18/2023   CL 99 09/18/2023   CO2 23 09/18/2023   BUN 9 09/18/2023   CREATININE 0.71 09/18/2023   EGFR 104 09/18/2023   CALCIUM 10.1 09/18/2023   PROT 7.1 09/18/2023   ALBUMIN  4.6 09/18/2023   LABGLOB 2.5 09/18/2023   BILITOT 0.9 09/18/2023   ALKPHOS 127 (H) 09/18/2023   AST 13 09/18/2023   ALT 18 09/18/2023   ANIONGAP 11 05/29/2023   Last lipids Lab Results  Component Value Date   CHOL 178 09/18/2023   HDL 79 09/18/2023   LDLCALC 82 09/18/2023   TRIG 96 09/18/2023   The 10-year ASCVD risk score (Arnett DK, et al., 2019) is: 1%  Last hemoglobin A1c Lab Results  Component Value Date   HGBA1C 8.9 (H) 09/18/2023        Objective    BP 114/81   Pulse 96   Ht 5\' 5"  (1.651 m)   Wt 236 lb (107 kg)   SpO2 98%    BMI 39.27 kg/m  BP Readings from Last 3 Encounters:  01/28/24 114/81  10/29/23 124/70  09/18/23 122/88   Wt Readings from Last 3 Encounters:  01/28/24 236 lb (107 kg)  09/18/23 253 lb 9.6 oz (115 kg)  05/29/23 228 lb (103.4 kg)        Physical Exam Vitals reviewed.  Constitutional:      General: She is not in acute distress.    Appearance: Normal appearance. She is not ill-appearing.  Cardiovascular:     Rate and Rhythm: Normal rate and regular rhythm.     Pulses:          Dorsalis pedis pulses are 2+ on the right side and 2+ on the left side.       Posterior tibial pulses are 2+ on the right side and 2+ on the left side.  Pulmonary:     Effort: Pulmonary effort is normal. No respiratory distress.     Breath sounds: No wheezing, rhonchi or rales.  Musculoskeletal:     Right foot: Normal range of motion. No deformity or bunion.     Left foot: Normal range of motion. No deformity or bunion.  Feet:     Right foot:     Protective Sensation: 6 sites tested.  6 sites sensed.     Skin integrity: Skin integrity normal. No erythema.     Toenail Condition: Right toenails are normal.     Left foot:     Protective Sensation: 6 sites tested.  6 sites sensed.     Skin integrity: Skin integrity normal. No erythema.     Toenail Condition: Left toenails are normal.  Neurological:     Mental Status: She is alert and oriented to person, place, and time.  Psychiatric:        Mood and Affect: Mood normal.        Behavior: Behavior normal.       No results found for any visits on 01/28/24.  Assessment & Plan     Problem List Items Addressed This Visit       Endocrine   Type 2 diabetes mellitus with hyperglycemia, without long-term current use of insulin (HCC) - Primary   Type 2 diabetes mellitus, Chronic  Type 2 diabetes mellitus with a previous hemoglobin A1c of 8.9% in January. She has lost 18 pounds since the last visit, indicating positive lifestyle changes. Trulicity  was  initially discontinued due to cost, but she will continue it as her financial situation has changed. She is motivated to manage glucose levels through diet and exercise, with a goal to achieve a fasting glucose level of 120 mg/dL or less. - Order updated hemoglobin A1c. - Continue Trulicity  1.5 mg weekly. - Discontinue glipizide   2.5 mg daily. - Continue metformin  750 mg daily. - Recommend checking glucose twice daily. - Encourage diet and exercise for glucose management. - Schedule diabetes foot exam and recommend diabetes eye exam.      Relevant Medications   Dulaglutide  (TRULICITY ) 1.5 MG/0.5ML SOAJ   Other Relevant Orders   POCT HgB A1C     Other   Perimenopausal   Menopausal symptoms including hot flashes, leg cramps, and body aches. She is cautious about hormone replacement therapy due to cancer concerns and is interested in non-hormonal options and lifestyle modifications. - Consider magnesium supplements for muscle cramps. - Consider evening primrose for menopausal symptoms. - Discuss non-hormonal treatment options like Veozah, pending liver function. - Encourage lifestyle modifications for symptom management.      Attention deficit hyperactivity disorder (ADHD), predominantly inattentive type   Attention-deficit hyperactivity disorder, predominantly inattentive presentation Chronic ADHD managed with Adderall 20 mg daily, with a preference for a lower dose on non-workdays. - Continue Adderall 20 mg daily during workdays. - Prescribe Adderall 10 mg for use on weekends or non-workdays.      Relevant Medications   amphetamine -dextroamphetamine  (ADDERALL XR) 10 MG 24 hr capsule     Assessment & Plan       Return in about 6 months (around 07/30/2024) for DM.         Mimi Alt, MD  [facility] [phone] (phone) [phone] (fax)  [facility]

## 2024-01-29 ENCOUNTER — Ambulatory Visit: Payer: Self-pay | Admitting: Family Medicine

## 2024-01-29 DIAGNOSIS — N951 Menopausal and female climacteric states: Secondary | ICD-10-CM | POA: Insufficient documentation

## 2024-01-29 LAB — POCT GLYCOSYLATED HEMOGLOBIN (HGB A1C): Hemoglobin A1C: 5.9 % — AB (ref 4.0–5.6)

## 2024-01-29 NOTE — Assessment & Plan Note (Signed)
 Menopausal symptoms including hot flashes, leg cramps, and body aches. She is cautious about hormone replacement therapy due to cancer concerns and is interested in non-hormonal options and lifestyle modifications. - Consider magnesium supplements for muscle cramps. - Consider evening primrose for menopausal symptoms. - Discuss non-hormonal treatment options like Veozah, pending liver function. - Encourage lifestyle modifications for symptom management.

## 2024-01-29 NOTE — Assessment & Plan Note (Signed)
 Type 2 diabetes mellitus, Chronic  Type 2 diabetes mellitus with a previous hemoglobin A1c of 8.9% in January. She has lost 18 pounds since the last visit, indicating positive lifestyle changes. Trulicity  was initially discontinued due to cost, but she will continue it as her financial situation has changed. She is motivated to manage glucose levels through diet and exercise, with a goal to achieve a fasting glucose level of 120 mg/dL or less. - Order updated hemoglobin A1c. - Continue Trulicity  1.5 mg weekly. - Discontinue glipizide  2.5 mg daily. - Continue metformin  750 mg daily. - Recommend checking glucose twice daily. - Encourage diet and exercise for glucose management. - Schedule diabetes foot exam and recommend diabetes eye exam.

## 2024-01-29 NOTE — Assessment & Plan Note (Signed)
 Attention-deficit hyperactivity disorder, predominantly inattentive presentation Chronic ADHD managed with Adderall 20 mg daily, with a preference for a lower dose on non-workdays. - Continue Adderall 20 mg daily during workdays. - Prescribe Adderall 10 mg for use on weekends or non-workdays.

## 2024-02-04 ENCOUNTER — Other Ambulatory Visit: Payer: Self-pay

## 2024-02-09 ENCOUNTER — Other Ambulatory Visit: Payer: Self-pay

## 2024-02-18 ENCOUNTER — Ambulatory Visit: Payer: Self-pay | Admitting: Family Medicine

## 2024-03-07 ENCOUNTER — Other Ambulatory Visit: Payer: Self-pay

## 2024-03-08 ENCOUNTER — Other Ambulatory Visit: Payer: Self-pay

## 2024-03-15 ENCOUNTER — Other Ambulatory Visit: Payer: Self-pay

## 2024-03-29 ENCOUNTER — Telehealth: Payer: Self-pay | Admitting: Family Medicine

## 2024-03-29 DIAGNOSIS — J069 Acute upper respiratory infection, unspecified: Secondary | ICD-10-CM

## 2024-03-29 DIAGNOSIS — J40 Bronchitis, not specified as acute or chronic: Secondary | ICD-10-CM

## 2024-03-29 MED ORDER — DOXYCYCLINE HYCLATE 100 MG PO TABS: 100.0000 mg | ORAL_TABLET | Freq: Two times a day (BID) | ORAL | 0 refills | Status: AC

## 2024-03-29 MED ORDER — PROMETHAZINE-DM 6.25-15 MG/5ML PO SYRP: 5.0000 mL | ORAL_SOLUTION | Freq: Four times a day (QID) | ORAL | 0 refills | Status: AC | PRN

## 2024-03-29 NOTE — Progress Notes (Signed)
 Virtual Visit Consent   Dana Randolph, you are scheduled for a virtual visit with a [facility] provider today. Just as with appointments in the office, your consent must be obtained to participate. Your consent will be active for this visit and any virtual visit you may have with one of our providers in the next 365 days. If you have a MyChart account, a copy of this consent can be sent to you electronically.  As this is a virtual visit, video technology does not allow for your provider to perform a traditional examination. This may limit your provider's ability to fully assess your condition. If your provider identifies any concerns that need to be evaluated in person or the need to arrange testing (such as labs, EKG, etc.), we will make arrangements to do so. Although advances in technology are sophisticated, we cannot ensure that it will always work on either your end or our end. If the connection with a video visit is poor, the visit may have to be switched to a telephone visit. With either a video or telephone visit, we are not always able to ensure that we have a secure connection.  By engaging in this virtual visit, you consent to the provision of healthcare and authorize for your insurance to be billed (if applicable) for the services provided during this visit. Depending on your insurance coverage, you may receive a charge related to this service.  I need to obtain your verbal consent now. Are you willing to proceed with your visit today? Dana Randolph has provided verbal consent on 03/29/2024 for a virtual visit (video or telephone). Dana Lamp, FNP  Date: 03/29/2024 5:08 PM   Virtual Visit via Video Note   I, Dana Randolph, connected with  Dana Randolph  ([id number], 1970-11-03) on 03/29/24 at  5:00 PM EDT by a video-enabled telemedicine application and verified that I am speaking with the correct person using two identifiers.  Location: Patient: Virtual Visit Location Patient:  Home Provider: Virtual Visit Location Provider: Home Office   I discussed the limitations of evaluation and management by telemedicine and the availability of in person appointments. The patient expressed understanding and agreed to proceed.    History of Present Illness: Dana Randolph is a 53 y.o. who identifies as a female who was assigned female at birth, and is being seen today for cough  congestion, neg covid test, mucinex not helping. Prod cough, yellow mucus, history of pneumonia. Sx for 12 days.   HPI: HPI  Problems:  Patient Active Problem List   Diagnosis Date Noted   Perimenopausal 01/29/2024   Type 2 diabetes mellitus with hyperglycemia, without long-term current use of insulin (HCC) 09/21/2023   Varicose veins of both legs with edema 09/19/2023   Elevated blood pressure reading 09/19/2023   Class 3 severe obesity due to excess calories without serious comorbidity with body mass index (BMI) of 40.0 to 44.9 in adult 09/19/2023   Attention deficit hyperactivity disorder (ADHD), predominantly inattentive type 09/18/2023   Alternating constipation and diarrhea 09/18/2023   S/P endometrial ablation 08/28/2021   H/O cervical polypectomy 08/28/2021   Abnormal uterine bleeding 01/16/2021   Encounter for screening breast examination 09/08/2019   Breast pain, right 09/08/2019    Allergies:  Allergies  Allergen Reactions   Aspirin Anaphylaxis    Throat swelling   Penicillins Anaphylaxis    Throat swelling   Sudafed [Pseudoephedrine] Anaphylaxis    Throat swelling   Codeine Nausea And Vomiting   Erythromycin Nausea And Vomiting  Medications:  Current Outpatient Medications:    doxycycline  (VIBRA -TABS) 100 MG tablet, Take 1 tablet (100 mg total) by mouth 2 (two) times daily for 10 days., Disp: 20 tablet, Rfl: 0   promethazine -dextromethorphan (PROMETHAZINE -DM) 6.25-15 MG/5ML syrup, Take 5 mLs by mouth 4 (four) times daily as needed for up to 10 days for cough., Disp: 118 mL,  Rfl: 0   amphetamine -dextroamphetamine  (ADDERALL XR) 10 MG 24 hr capsule, Take 1 capsule (10 mg total) by mouth daily., Disp: 30 capsule, Rfl: 0   amphetamine -dextroamphetamine  (ADDERALL XR) 20 MG 24 hr capsule, Take 1 capsule (20 mg total) by mouth daily., Disp: 30 capsule, Rfl: 0   amphetamine -dextroamphetamine  (ADDERALL XR) 20 MG 24 hr capsule, Take 1 capsule (20 mg total) by mouth daily., Disp: 30 capsule, Rfl: 0   amphetamine -dextroamphetamine  (ADDERALL XR) 20 MG 24 hr capsule, Take 1 capsule (20 mg total) by mouth daily., Disp: 30 capsule, Rfl: 0   Dulaglutide  (TRULICITY ) 1.5 MG/0.5ML SOAJ, Inject 1.5 mg into the skin once a week., Disp: 6 mL, Rfl: 3   metFORMIN  (GLUCOPHAGE -XR) 750 MG 24 hr tablet, Take 1 tablet (750 mg total) by mouth daily with breakfast., Disp: 90 tablet, Rfl: 3   trimethoprim -polymyxin b  (POLYTRIM ) ophthalmic solution, Place 1 drop into the right eye in the morning, at noon, in the evening, and at bedtime. (Patient not taking: Reported on 01/28/2024), Disp: 10 mL, Rfl: 0  Observations/Objective: Patient is well-developed, well-nourished in no acute distress.  Resting comfortably  at home.  Head is normocephalic, atraumatic.  No labored breathing.  Speech is clear and coherent with logical content.  Patient is alert and oriented at baseline.    Assessment and Plan: 1. Bronchitis (Primary)  2. Upper respiratory tract infection, unspecified type  Increase fliuds, humidifier at night, cont mucinex, UC if sx persist or worsen.   Follow Up Instructions: I discussed the assessment and treatment plan with the patient. The patient was provided an opportunity to ask questions and all were answered. The patient agreed with the plan and demonstrated an understanding of the instructions.  A copy of instructions were sent to the patient via MyChart unless otherwise noted below.     The patient was advised to call back or seek an in-person evaluation if the symptoms worsen or  if the condition fails to improve as anticipated.    Dana Foskey, FNP

## 2024-03-29 NOTE — Patient Instructions (Signed)

## 2024-04-07 ENCOUNTER — Other Ambulatory Visit: Payer: Self-pay

## 2024-04-12 ENCOUNTER — Other Ambulatory Visit: Payer: Self-pay

## 2024-04-21 ENCOUNTER — Other Ambulatory Visit: Payer: Self-pay

## 2024-04-21 DIAGNOSIS — F900 Attention-deficit hyperactivity disorder, predominantly inattentive type: Secondary | ICD-10-CM

## 2024-04-21 NOTE — Telephone Encounter (Signed)
 Converted into a refill request

## 2024-04-22 ENCOUNTER — Other Ambulatory Visit: Payer: Self-pay | Admitting: Family Medicine

## 2024-04-22 DIAGNOSIS — F900 Attention-deficit hyperactivity disorder, predominantly inattentive type: Secondary | ICD-10-CM

## 2024-04-22 MED ORDER — AMPHETAMINE-DEXTROAMPHET ER 20 MG PO CP24
20.0000 mg | ORAL_CAPSULE | Freq: Every day | ORAL | 0 refills | Status: DC
Start: 2024-04-22 — End: 2024-07-05

## 2024-04-22 MED ORDER — AMPHETAMINE-DEXTROAMPHETAMINE 5 MG PO TABS: 5.0000 mg | ORAL_TABLET | Freq: Every day | ORAL | 0 refills | Status: DC

## 2024-04-29 ENCOUNTER — Ambulatory Visit
Admission: RE | Admit: 2024-04-29 | Discharge: 2024-04-29 | Disposition: A | Discharge status: Home / Self Care | Payer: Self-pay | Source: Ambulatory Visit | Attending: Emergency Medicine | Admitting: Emergency Medicine

## 2024-04-29 ENCOUNTER — Ambulatory Visit
Admission: RE | Admit: 2024-04-29 | Discharge: 2024-04-29 | Disposition: A | Discharge status: Home / Self Care | Payer: Self-pay | Attending: Emergency Medicine | Admitting: Emergency Medicine

## 2024-04-29 VITALS — BP 120/85 | HR 100 | Temp 97.5°F | Resp 20

## 2024-04-29 DIAGNOSIS — R053 Chronic cough: Secondary | ICD-10-CM

## 2024-04-29 DIAGNOSIS — J0100 Acute maxillary sinusitis, unspecified: Secondary | ICD-10-CM

## 2024-04-29 DIAGNOSIS — R0602 Shortness of breath: Secondary | ICD-10-CM

## 2024-04-29 DIAGNOSIS — J209 Acute bronchitis, unspecified: Secondary | ICD-10-CM

## 2024-04-29 MED ORDER — ALBUTEROL SULFATE HFA 108 (90 BASE) MCG/ACT IN AERS: 1.0000 | INHALATION_SPRAY | Freq: Four times a day (QID) | RESPIRATORY_TRACT | 0 refills | Status: AC | PRN

## 2024-04-29 MED ORDER — CEFDINIR 300 MG PO CAPS
300.0000 mg | ORAL_CAPSULE | Freq: Two times a day (BID) | ORAL | 0 refills | Status: DC
Start: 2024-04-29 — End: 2024-07-05

## 2024-04-29 MED ORDER — PREDNISONE 10 MG (21) PO TBPK
ORAL_TABLET | Freq: Every day | ORAL | 0 refills | Status: DC
Start: 2024-04-29 — End: 2024-07-05

## 2024-04-29 NOTE — Discharge Instructions (Addendum)
 Go to [facility] for your x-ray.    [address]  [address] [place], [place] [address] [phone]  I will call you with the results.  Take the prednisone  and cefdinir  as directed.  Use the albuterol  inhaler as directed.  Follow up with your primary care provider tomorrow.  Go to the emergency department if you have worsening symptoms.

## 2024-04-29 NOTE — ED Provider Notes (Signed)
 Dana Randolph    CSN: [id number] Arrival date & time: 04/29/24  1158      History   Chief Complaint Chief Complaint  Patient presents with   Cough    I have been sick since 7/9! Started with head cold, went to my chest. I did a virtual visit. Took antibiotics and cough meds. No relief from the cough, extremely fatigued my noon daily and overall do not feel well. - Entered by patient    HPI Dana Randolph is a 53 y.o. female.  Patient presents with 6-week history of congestion and cough.  Her cough is productive.  She reports shortness of breath with exertion.  No known fever.  No chest pain.  Treatment attempted with cough medication.  Patient had a telehealth visit on 03/29/2024; diagnosed with bronchitis and URI; treated with doxycycline  and Promethazine  DM.  Patient reports history of bronchitis and pneumonia.  The history is provided by the patient and medical records.    Past Medical History:  Diagnosis Date   Allergy    Bronchitis    Pneumonia     Patient Active Problem List   Diagnosis Date Noted   Perimenopausal 01/29/2024   Type 2 diabetes mellitus with hyperglycemia, without long-term current use of insulin (HCC) 09/21/2023   Varicose veins of both legs with edema 09/19/2023   Elevated blood pressure reading 09/19/2023   Class 3 severe obesity due to excess calories without serious comorbidity with body mass index (BMI) of 40.0 to 44.9 in adult 09/19/2023   Attention deficit hyperactivity disorder (ADHD), predominantly inattentive type 09/18/2023   Alternating constipation and diarrhea 09/18/2023   S/P endometrial ablation 08/28/2021   H/O cervical polypectomy 08/28/2021   Abnormal uterine bleeding 01/16/2021   Encounter for screening breast examination 09/08/2019   Breast pain, right 09/08/2019    Past Surgical History:  Procedure Laterality Date   CHOLECYSTECTOMY  2009   DILITATION & CURRETTAGE/HYSTROSCOPY WITH NOVASURE ABLATION N/A 08/19/2021    Procedure: DILATATION & CURETTAGE/HYSTEROSCOPY WITH POLYPECTOMY AND MINERVA ABLATION;  Surgeon: Connell Davies, MD;  Location: [facility] ORS;  Service: Gynecology;  Laterality: N/A;   TONSILLECTOMY  2000    OB History     Gravida  4   Para  4   Term  4   Preterm      AB      Living  4      SAB      IAB      Ectopic      Multiple      Live Births  4            Home Medications    Prior to Admission medications   Medication Sig Start Date End Date Taking? Authorizing Provider  albuterol  (VENTOLIN  HFA) 108 (90 Base) MCG/ACT inhaler Inhale 1-2 puffs into the lungs every 6 (six) hours as needed. 04/29/24  Yes Corlis Burnard DEL, NP  cefdinir  (OMNICEF ) 300 MG capsule Take 1 capsule (300 mg total) by mouth 2 (two) times daily. 04/29/24  Yes Corlis Burnard DEL, NP  predniSONE  (STERAPRED UNI-PAK 21 TAB) 10 MG (21) TBPK tablet Take by mouth daily. As directed 04/29/24  Yes Corlis Burnard DEL, NP  amphetamine -dextroamphetamine  (ADDERALL XR) 20 MG 24 hr capsule Take 1 capsule (20 mg total) by mouth daily. 04/22/24   Simmons-Robinson, Rockie, MD  amphetamine -dextroamphetamine  (ADDERALL) 5 MG tablet Take 1 tablet (5 mg total) by mouth daily. 04/22/24   Simmons-Robinson, Rockie, MD  Dulaglutide  (TRULICITY ) 1.5 MG/0.5ML EMMANUEL  Inject 1.5 mg into the skin once a week. 01/28/24   Simmons-Robinson, Rockie, MD  metFORMIN  (GLUCOPHAGE -XR) 750 MG 24 hr tablet Take 1 tablet (750 mg total) by mouth daily with breakfast. Patient not taking: Reported on 04/29/2024 09/21/23   Simmons-Robinson, Rockie, MD    Family History Family History  Problem Relation Age of Onset   Lung cancer Mother 24 - 16   ADD / ADHD Mother    Alcohol abuse Mother    Thyroid cancer Mother 8 - 67   Skin cancer Mother 22 - 9   Diabetes Father    COPD Father    Hypertension Father    Heart attack Father    Alcohol abuse Father    Heart disease Father    ADD / ADHD Daughter    ADD / ADHD Son    Breast cancer Paternal Aunt    Breast  cancer Paternal Aunt    Breast cancer Maternal Great-grandmother     Social History Social History   Tobacco Use   Smoking status: Never   Smokeless tobacco: Never  Vaping Use   Vaping status: Never Used  Substance Use Topics   Alcohol use: Yes    Alcohol/week: 8.0 standard drinks of alcohol    Types: 2 Glasses of wine, 6 Cans of beer per week    Comment: occ   Drug use: Never     Allergies   Aspirin, Penicillins, Sudafed [pseudoephedrine], Codeine, and Erythromycin   Review of Systems Review of Systems  Constitutional:  Negative for chills and fever.  HENT:  Positive for congestion and postnasal drip. Negative for ear pain and sore throat.   Respiratory:  Positive for cough and shortness of breath.      Physical Exam Triage Vital Signs ED Triage Vitals [04/29/24 1200]  Encounter Vitals Group     BP      Girls Systolic BP Percentile      Girls Diastolic BP Percentile      Boys Systolic BP Percentile      Boys Diastolic BP Percentile      Pulse      Resp      Temp      Temp src      SpO2      Weight      Height      Head Circumference      Peak Flow      Pain Score 6     Pain Loc      Pain Education      Exclude from Growth Chart    No data found.  Updated Vital Signs BP 120/85   Pulse 100   Temp (!) 97.5 F (36.4 C)   Resp 20   SpO2 99%   Visual Acuity Right Eye Distance:   Left Eye Distance:   Bilateral Distance:    Right Eye Near:   Left Eye Near:    Bilateral Near:     Physical Exam Constitutional:      General: She is not in acute distress. HENT:     Right Ear: Tympanic membrane normal.     Left Ear: Tympanic membrane normal.     Nose: Congestion and rhinorrhea present.     Mouth/Throat:     Mouth: Mucous membranes are moist.     Pharynx: Oropharynx is clear.  Cardiovascular:     Rate and Rhythm: Normal rate and regular rhythm.     Heart sounds: Normal heart sounds.  Pulmonary:  Effort: Pulmonary effort is normal. No  respiratory distress.     Breath sounds: Normal breath sounds.  Neurological:     Mental Status: She is alert.      UC Treatments / Results  Labs (all labs ordered are listed, but only abnormal results are displayed) Labs Reviewed - No data to display  EKG   Radiology DG Chest 2 View Result Date: 04/29/2024 EXAM: 2 VIEW(S) XRAY OF THE CHEST 04/29/2024 12:48:48 PM COMPARISON: 07/11/20 CLINICAL HISTORY: Persistent cough x 6 weeks. FINDINGS: LUNGS AND PLEURA: No focal pulmonary opacity. No pulmonary edema. No pleural effusion. No pneumothorax. Chronic elevation of right hemidiaphragm. HEART AND MEDIASTINUM: Normal heart size and mediastinal contours. BONES AND SOFT TISSUES: No acute osseous abnormality. IMPRESSION: 1. No acute findings or explanation for cough. Electronically signed by: Andrea Gasman MD 04/29/2024 01:46 PM EDT RP Workstation: [id number]    Procedures Procedures (including critical care time)  Medications Ordered in UC Medications - No data to display  Initial Impression / Assessment and Plan / UC Course  I have reviewed the triage vital signs and the nursing notes.  Pertinent labs & imaging results that were available during my care of the patient were reviewed by me and considered in my medical decision making (see chart for details).   Persistent cough, shortness of breath, acute sinusitis, acute bronchitis.  O2 sat 99% on room air.  CXR negative.  Treating today with albuterol  inhaler, prednisone , and cefdinir  (patient reports she has taken cefdinir  in the past without difficulty).  Instructed patient to follow-up with her PCP.  ED precautions given.  She agrees to plan of care.  Final Clinical Impressions(s) / UC Diagnoses   Final diagnoses:  Persistent cough  Shortness of breath  Acute non-recurrent maxillary sinusitis  Acute bronchitis, unspecified organism     Discharge Instructions      Go to [facility] for your  x-ray.    [address] [facility] [address]  [address] [place], [place] [address] [phone]  I will call you with the results.  Take the prednisone  and cefdinir  as directed.  Use the albuterol  inhaler as directed.  Follow up with your primary care provider tomorrow.  Go to the emergency department if you have worsening symptoms.        ED Prescriptions     Medication Sig Dispense Auth. Provider   albuterol  (VENTOLIN  HFA) 108 (90 Base) MCG/ACT inhaler Inhale 1-2 puffs into the lungs every 6 (six) hours as needed. 18 g Corlis Burnard DEL, NP   predniSONE  (STERAPRED UNI-PAK 21 TAB) 10 MG (21) TBPK tablet Take by mouth daily. As directed 21 tablet Corlis Burnard DEL, NP   cefdinir  (OMNICEF ) 300 MG capsule Take 1 capsule (300 mg total) by mouth 2 (two) times daily. 10 capsule Corlis Burnard DEL, NP      PDMP not reviewed this encounter.   Corlis Burnard DEL, NP 04/29/24 [phone]

## 2024-04-29 NOTE — ED Triage Notes (Signed)
 Patient to Urgent Care with complaints of productive cough/ fatigue/ chest congestion and tightness. Denies any known fevers.   Reports symptoms started 7/9. Completed 10 day course of doxycycline / steroids/ cough medications prescribed 7/22 via e-visit. Little change in symptoms.

## 2024-05-17 ENCOUNTER — Other Ambulatory Visit: Payer: Self-pay

## 2024-05-17 ENCOUNTER — Ambulatory Visit: Payer: Self-pay | Admitting: Obstetrics and Gynecology

## 2024-05-24 ENCOUNTER — Ambulatory Visit: Payer: Self-pay | Admitting: Obstetrics & Gynecology

## 2024-05-24 VITALS — BP 128/87 | HR 94 | Ht 65.0 in | Wt 232.4 lb

## 2024-05-24 DIAGNOSIS — N920 Excessive and frequent menstruation with regular cycle: Secondary | ICD-10-CM

## 2024-05-24 NOTE — Progress Notes (Signed)
    GYNECOLOGY PROGRESS NOTE  Subjective:    Patient ID: Dana Randolph, female    DOB: 1971/03/23, 53 y.o.   MRN: [id number]  HPI  Patient is a 53 y.o. [id number] married here as a new patient. Her stated goal is I want a hysterectomy. She reports heavy periods since the birth of her third child 24 years ago. She reports that she had an IUD with no help, that she had an ablation in 2022 that made it worse. She had a d&c at that time and the pathology was benign. Her TSH was normal 09/2023. She has not had a recent HBG but did have one last year that was 14. I saw a pelvic ultrasound in 2022 done in the ED for heavy bleeding. It showed several small fibroids including a 1.2 cm submucosal fibroid.  Her last pap smear was in 2021. She is married to a woman.  The following portions of the patient's history were reviewed and updated as appropriate: allergies, current medications, past family history, past medical history, past social history, past surgical history, and problem list.  Review of Systems Pertinent items are noted in HPI.   Objective:   Blood pressure 128/87, pulse 94, height 5' 5 (1.651 m), weight 232 lb 6.4 oz (105.4 kg). Body mass index is 38.67 kg/m. Well nourished, well hydrated White female, no apparent distress She is ambulating and conversing normally. She declines any/all exams.   Assessment:   Heavy periods with the stated goal of having a hysterectomy   Plan:   I explained that prior to any theoretical hysterectomy she would need a pap smear but she declines that today. She is wiling to have a CBC drawn today. I explained that we do not have any provider in the office currently that does major surgeries. I told her that the only other OB/GYN practice in [place] is [facility]. She plans to call them.

## 2024-05-25 LAB — CBC
Hematocrit: 41.6 % (ref 34.0–46.6)
Hemoglobin: 13.3 g/dL (ref 11.1–15.9)
MCH: 27.3 pg (ref 26.6–33.0)
MCHC: 32 g/dL (ref 31.5–35.7)
MCV: 85 fL (ref 79–97)
Platelets: 304 x10E3/uL (ref 150–450)
RBC: 4.87 x10E6/uL (ref 3.77–5.28)
RDW: 12.2 % (ref 11.7–15.4)
WBC: 9.4 x10E3/uL (ref 3.4–10.8)

## 2024-05-30 ENCOUNTER — Other Ambulatory Visit: Payer: Self-pay

## 2024-06-03 ENCOUNTER — Other Ambulatory Visit: Payer: Self-pay

## 2024-06-28 ENCOUNTER — Other Ambulatory Visit: Payer: Self-pay

## 2024-06-29 ENCOUNTER — Other Ambulatory Visit: Payer: Self-pay | Admitting: Medical Genetics

## 2024-06-29 DIAGNOSIS — Z006 Encounter for examination for normal comparison and control in clinical research program: Secondary | ICD-10-CM

## 2024-07-04 ENCOUNTER — Other Ambulatory Visit: Payer: Self-pay

## 2024-07-04 ENCOUNTER — Encounter: Payer: Self-pay | Admitting: Family Medicine

## 2024-07-04 DIAGNOSIS — F900 Attention-deficit hyperactivity disorder, predominantly inattentive type: Secondary | ICD-10-CM

## 2024-07-05 MED ORDER — AMPHETAMINE-DEXTROAMPHETAMINE 5 MG PO TABS: 5.0000 mg | ORAL_TABLET | Freq: Every day | ORAL | 0 refills | Status: AC

## 2024-07-05 MED ORDER — AMPHETAMINE-DEXTROAMPHET ER 20 MG PO CP24: 20.0000 mg | ORAL_CAPSULE | Freq: Every day | ORAL | 0 refills | Status: DC

## 2024-07-11 ENCOUNTER — Other Ambulatory Visit: Payer: Self-pay

## 2024-07-28 ENCOUNTER — Ambulatory Visit: Payer: Self-pay | Admitting: Family Medicine

## 2024-07-28 VITALS — BP 114/91 | HR 96 | Ht 65.0 in | Wt 228.1 lb

## 2024-07-28 DIAGNOSIS — E66812 Obesity, class 2: Secondary | ICD-10-CM

## 2024-07-28 DIAGNOSIS — E785 Hyperlipidemia, unspecified: Secondary | ICD-10-CM

## 2024-07-28 DIAGNOSIS — E1169 Type 2 diabetes mellitus with other specified complication: Secondary | ICD-10-CM

## 2024-07-28 DIAGNOSIS — N951 Menopausal and female climacteric states: Secondary | ICD-10-CM

## 2024-07-28 DIAGNOSIS — E1165 Type 2 diabetes mellitus with hyperglycemia: Secondary | ICD-10-CM

## 2024-07-28 DIAGNOSIS — R030 Elevated blood-pressure reading, without diagnosis of hypertension: Secondary | ICD-10-CM

## 2024-07-28 DIAGNOSIS — Z7985 Long-term (current) use of injectable non-insulin antidiabetic drugs: Secondary | ICD-10-CM

## 2024-07-28 DIAGNOSIS — Z23 Encounter for immunization: Secondary | ICD-10-CM

## 2024-07-28 DIAGNOSIS — F900 Attention-deficit hyperactivity disorder, predominantly inattentive type: Secondary | ICD-10-CM

## 2024-07-28 LAB — POCT GLYCOSYLATED HEMOGLOBIN (HGB A1C): Hemoglobin A1C: 5.7 % — AB (ref 4.0–5.6)

## 2024-07-28 MED ORDER — VENLAFAXINE HCL ER 37.5 MG PO CP24: 37.5000 mg | ORAL_CAPSULE | Freq: Every day | ORAL | 2 refills | Status: AC

## 2024-07-28 MED ORDER — LISINOPRIL 5 MG PO TABS
2.5000 mg | ORAL_TABLET | Freq: Every day | ORAL | 3 refills | Status: AC
Start: 2024-07-28 — End: ?

## 2024-07-28 MED ORDER — AMPHETAMINE-DEXTROAMPHET ER 30 MG PO CP24
30.0000 mg | ORAL_CAPSULE | Freq: Every day | ORAL | 0 refills | Status: AC
Start: 2024-07-28 — End: ?

## 2024-07-28 NOTE — Patient Instructions (Addendum)
     To keep you healthy, please keep in mind the following health maintenance items that you are due for:   Health Maintenance Due  Topic Date Due   OPHTHALMOLOGY EXAM  Never done   Diabetic kidney evaluation - Urine ACR  Never done   Mammogram  09/07/2021     Best Wishes,   Dr. Lang

## 2024-07-28 NOTE — Progress Notes (Signed)
 Established patient visit   Patient: Dana Randolph   DOB: 03/27/1971   53 y.o. Female  MRN: [id number] Visit Date: 07/28/2024  Today's healthcare provider: Rockie Agent, MD   Chief Complaint  Patient presents with   Medical Management of Chronic Issues    Patient is present for chronic follow up, doing well overall    Diabetes   Subjective     HPI     Medical Management of Chronic Issues    Additional comments: Patient is present for chronic follow up, doing well overall       Last edited by Cherry Chiquita HERO, CMA on 07/28/2024  3:58 PM.       Discussed the use of AI scribe software for clinical note transcription with the patient, who gave verbal consent to proceed.  History of Present Illness Dana Randolph is a 53 year old female with type two diabetes who presents for a chronic follow-up.  She has type two diabetes and is currently taking Trulicity  1.5 mg weekly. She is due for her influenza vaccine today.  She has a history of obesity and has been successfully losing weight, with her BMI now classified as class two obesity. Her weight has decreased from 261 pounds in 2022 to approximately 229 pounds currently.  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  She takes Adderall 20 mg daily for ADHD, with additional 5 mg doses as needed, particularly on long workdays. She is considering adjusting her Adderall dosage due to the need for additional doses in the afternoon.  She experiences menopausal symptoms, primarily hot flashes and mood swings. She uses black cohosh to manage these symptoms but is cautious about hormonal treatments due to a family history of cancer, including her mother's history of lung cancer.  She reports past use of an albuterol  inhaler, which she received in August when she was sick, but she is not currently using it.     Past Medical  History:  Diagnosis Date   Allergy    Bronchitis    Pneumonia     Medications: Outpatient Medications Prior to Visit  Medication Sig   albuterol  (VENTOLIN  HFA) 108 (90 Base) MCG/ACT inhaler Inhale 1-2 puffs into the lungs every 6 (six) hours as needed.   [START ON 08/03/2024] amphetamine -dextroamphetamine  (ADDERALL) 5 MG tablet Take 1 tablet (5 mg total) by mouth daily.   amphetamine -dextroamphetamine  (ADDERALL) 5 MG tablet Take 1 tablet (5 mg total) by mouth daily.   Dulaglutide  (TRULICITY ) 1.5 MG/0.5ML SOAJ Inject 1.5 mg into the skin once a week.   [DISCONTINUED] amphetamine -dextroamphetamine  (ADDERALL XR) 20 MG 24 hr capsule Take 1 capsule (20 mg total) by mouth daily.   [DISCONTINUED] amphetamine -dextroamphetamine  (ADDERALL XR) 20 MG 24 hr capsule Take 1 capsule (20 mg total) by mouth daily.   No facility-administered medications prior to visit.    Review of Systems  Last metabolic panel Lab Results  Component Value Date   GLUCOSE 186 (H) 09/18/2023   NA 140 09/18/2023   K 5.0 09/18/2023   CL 99 09/18/2023   CO2 23 09/18/2023   BUN 9 09/18/2023   CREATININE 0.71 09/18/2023   EGFR 104 09/18/2023   CALCIUM 10.1 09/18/2023   PROT 7.1 09/18/2023   ALBUMIN 4.6 09/18/2023   LABGLOB 2.5 09/18/2023   BILITOT 0.9 09/18/2023   ALKPHOS 127 (H) 09/18/2023   AST 13 09/18/2023   ALT 18 09/18/2023  ANIONGAP 11 05/29/2023   Last lipids Lab Results  Component Value Date   CHOL 178 09/18/2023   HDL 79 09/18/2023   LDLCALC 82 09/18/2023   TRIG 96 09/18/2023   The 10-year ASCVD risk score (Arnett DK, et al., 2019) is: 1.2%  Last hemoglobin A1c Lab Results  Component Value Date   HGBA1C 5.7 (A) 07/28/2024        Objective    BP (!) 114/91 (BP Location: Left Arm, Patient Position: Sitting, Cuff Size: Normal)   Pulse 96   Ht 5' 5 (1.651 m)   Wt 228 lb 1.6 oz (103.5 kg)   SpO2 98%   BMI 37.96 kg/m  BP Readings from Last 3 Encounters:  07/28/24 (!) 114/91   05/24/24 128/87  04/29/24 120/85   Wt Readings from Last 3 Encounters:  07/28/24 228 lb 1.6 oz (103.5 kg)  05/24/24 232 lb 6.4 oz (105.4 kg)  01/28/24 236 lb (107 kg)        Physical Exam Vitals reviewed.  Constitutional:      General: She is not in acute distress.    Appearance: Normal appearance. She is not ill-appearing.  Cardiovascular:     Rate and Rhythm: Normal rate and regular rhythm.  Pulmonary:     Effort: Pulmonary effort is normal. No respiratory distress.     Breath sounds: No wheezing, rhonchi or rales.  Neurological:     Mental Status: She is alert and oriented to person, place, and time.  Psychiatric:        Mood and Affect: Mood normal.        Behavior: Behavior normal.     Physical Exam     Results for orders placed or performed in visit on 07/28/24  Urine Albumin/Creatinine with ratio (send out) [LAB689]  Result Value Ref Range   Creatinine, Urine 187.3 Not Estab. mg/dL   Microalbumin, Urine 85.0 Not Estab. ug/mL   Microalb/Creat Ratio 8 0 - 29 mg/g creat  POCT HgB A1C  Result Value Ref Range   Hemoglobin A1C 5.7 (A) 4.0 - 5.6 %   HbA1c POC (<> result, manual entry)     HbA1c, POC (prediabetic range)     HbA1c, POC (controlled diabetic range)      Assessment & Plan     Problem List Items Addressed This Visit     Attention deficit hyperactivity disorder (ADHD), predominantly inattentive type   Chronic, improved ADHD is managed with Adderall 20 mg daily. She reports needing additional support during long workdays and is considering increasing the dose to 30 mg. Discussed the option of using regular Adderall instead of extended release for additional support. - Increased Adderall to 30 mg daily - Continue Adderall 5 mg as needed for additional support      Relevant Medications   amphetamine -dextroamphetamine  (ADDERALL XR) 30 MG 24 hr capsule   Class 3 severe obesity due to excess calories without serious comorbidity with body mass index  (BMI) of 40.0 to 44.9 in adult [facility])   Chronic  BMI decreased to 37 from 42 - Monitor weight and provide lifestyle modification support      Relevant Medications   amphetamine -dextroamphetamine  (ADDERALL XR) 30 MG 24 hr capsule   Elevated blood pressure reading   Acute  Blood pressure is elevated with a diastolic of 91 and systolic of 114. Stress is a contributing factor. Lisinopril  is recommended for kidney protection rather than solely for blood pressure control. - Prescribed lisinopril  2.5 mg daily for kidney  protection      Relevant Medications   lisinopril  (ZESTRIL ) 5 MG tablet   Hyperlipidemia associated with type 2 diabetes mellitus (HCC)   Relevant Medications   lisinopril  (ZESTRIL ) 5 MG tablet   Type 2 diabetes mellitus with hyperglycemia, without long-term current use of insulin (HCC) - Primary   Type 2 diabetes mellitus in the setting of class 2 obesity Chronic  Type 2 diabetes is well controlled with an A1c of 5.9%. Class 2 obesity is present, but weight loss is ongoing with a reduction of 8 pounds since May. Current BMI is class 2 obesity. Trulicity  is effective in managing glucose levels. - Continue Trulicity  1.5 mg weekly - Ordered diabetes eye exam - Ordered urine albumin test - Continue lifestyle modifications for weight management      Relevant Medications   lisinopril  (ZESTRIL ) 5 MG tablet   Other Relevant Orders   Urine Albumin/Creatinine with ratio (send out) [LAB689] (Completed)   POCT HgB A1C (Completed)   Vasomotor symptoms due to menopause   Chronic  Primarily hot flashes and mood swings. She prefers non-hormonal options due to family history of cancer. Discussed potential treatments including gabapentin, venlafaxine , and clonidine. Gabapentin may cause weight gain, while venlafaxine  may cause nausea and dry skin. Clonidine is a blood pressure medication that may help with hot flashes. - Prescribed venlafaxine  37.5 mg for hot flashes and mood  stabilization - Will consider clonidine 0.1 mg at bedtime for hot flashes      Relevant Medications   venlafaxine  XR (EFFEXOR  XR) 37.5 MG 24 hr capsule   Other Visit Diagnoses       Immunization due       Relevant Orders   Flu vaccine trivalent PF, 6mos and older(Flulaval,Afluria,Fluarix,Fluzone) (Completed)       Assessment and Plan Assessment & Plan   Immunization: Influenza vaccine She is due for an influenza vaccine. - Administered influenza vaccine today     Return in about 3 months (around 10/28/2024) for BP, DM, VMS.         Rockie Agent, MD  [facility] [phone] (phone) [phone] (fax)  [facility]

## 2024-07-29 ENCOUNTER — Ambulatory Visit: Payer: Self-pay | Admitting: Family Medicine

## 2024-07-29 ENCOUNTER — Other Ambulatory Visit: Payer: Self-pay

## 2024-07-29 LAB — MICROALBUMIN / CREATININE URINE RATIO
Creatinine, Urine: 187.3 mg/dL
Microalb/Creat Ratio: 8 mg/g{creat} (ref 0–29)
Microalbumin, Urine: 14.9 ug/mL

## 2024-07-29 NOTE — Assessment & Plan Note (Signed)
 Chronic  BMI decreased to 37 from 42 - Monitor weight and provide lifestyle modification support

## 2024-07-29 NOTE — Assessment & Plan Note (Signed)
 Acute  Blood pressure is elevated with a diastolic of 91 and systolic of 114. Stress is a contributing factor. Lisinopril  is recommended for kidney protection rather than solely for blood pressure control. - Prescribed lisinopril  2.5 mg daily for kidney protection

## 2024-07-29 NOTE — Assessment & Plan Note (Signed)
 Chronic, improved ADHD is managed with Adderall 20 mg daily. She reports needing additional support during long workdays and is considering increasing the dose to 30 mg. Discussed the option of using regular Adderall instead of extended release for additional support. - Increased Adderall to 30 mg daily - Continue Adderall 5 mg as needed for additional support

## 2024-07-29 NOTE — Assessment & Plan Note (Signed)
 Type 2 diabetes mellitus in the setting of class 2 obesity Chronic  Type 2 diabetes is well controlled with an A1c of 5.9%. Class 2 obesity is present, but weight loss is ongoing with a reduction of 8 pounds since May. Current BMI is class 2 obesity. Trulicity  is effective in managing glucose levels. - Continue Trulicity  1.5 mg weekly - Ordered diabetes eye exam - Ordered urine albumin test - Continue lifestyle modifications for weight management

## 2024-07-29 NOTE — Assessment & Plan Note (Signed)
 Chronic  Primarily hot flashes and mood swings. She prefers non-hormonal options due to family history of cancer. Discussed potential treatments including gabapentin, venlafaxine , and clonidine. Gabapentin may cause weight gain, while venlafaxine  may cause nausea and dry skin. Clonidine is a blood pressure medication that may help with hot flashes. - Prescribed venlafaxine  37.5 mg for hot flashes and mood stabilization - Will consider clonidine 0.1 mg at bedtime for hot flashes

## 2024-08-05 LAB — GENECONNECT MOLECULAR SCREEN: Genetic Analysis Overall Interpretation: NEGATIVE

## 2024-08-25 ENCOUNTER — Other Ambulatory Visit: Payer: Self-pay

## 2024-09-05 ENCOUNTER — Other Ambulatory Visit: Payer: Self-pay

## 2024-10-07 ENCOUNTER — Telehealth: Payer: Self-pay

## 2024-10-07 DIAGNOSIS — N3000 Acute cystitis without hematuria: Secondary | ICD-10-CM

## 2024-10-08 ENCOUNTER — Other Ambulatory Visit: Payer: Self-pay

## 2024-10-08 MED ORDER — NITROFURANTOIN MONOHYD MACRO 100 MG PO CAPS: 100.0000 mg | ORAL_CAPSULE | Freq: Two times a day (BID) | ORAL | 0 refills | Status: AC

## 2024-10-08 NOTE — Progress Notes (Signed)

## 2024-10-12 ENCOUNTER — Telehealth: Payer: Self-pay | Admitting: Nurse Practitioner

## 2024-10-12 ENCOUNTER — Telehealth: Payer: Self-pay

## 2024-10-12 ENCOUNTER — Encounter: Payer: Self-pay | Admitting: Emergency Medicine

## 2024-10-12 ENCOUNTER — Other Ambulatory Visit: Payer: Self-pay

## 2024-10-12 ENCOUNTER — Ambulatory Visit: Payer: Self-pay | Admitting: *Deleted

## 2024-10-12 ENCOUNTER — Ambulatory Visit: Payer: Self-pay | Admitting: Physician Assistant

## 2024-10-12 ENCOUNTER — Emergency Department
Admission: EM | Admit: 2024-10-12 | Discharge: 2024-10-12 | Discharge status: Against Medical Advice | Payer: Self-pay | Attending: Emergency Medicine | Admitting: Emergency Medicine

## 2024-10-12 ENCOUNTER — Encounter: Payer: Self-pay | Admitting: Physician Assistant

## 2024-10-12 VITALS — BP 151/87 | HR 87 | Wt 239.0 lb

## 2024-10-12 DIAGNOSIS — R030 Elevated blood-pressure reading, without diagnosis of hypertension: Secondary | ICD-10-CM

## 2024-10-12 DIAGNOSIS — Z5321 Procedure and treatment not carried out due to patient leaving prior to being seen by health care provider: Secondary | ICD-10-CM | POA: Insufficient documentation

## 2024-10-12 DIAGNOSIS — N951 Menopausal and female climacteric states: Secondary | ICD-10-CM

## 2024-10-12 DIAGNOSIS — R110 Nausea: Secondary | ICD-10-CM | POA: Insufficient documentation

## 2024-10-12 DIAGNOSIS — R399 Unspecified symptoms and signs involving the genitourinary system: Secondary | ICD-10-CM

## 2024-10-12 DIAGNOSIS — R339 Retention of urine, unspecified: Secondary | ICD-10-CM

## 2024-10-12 DIAGNOSIS — R300 Dysuria: Secondary | ICD-10-CM | POA: Insufficient documentation

## 2024-10-12 DIAGNOSIS — N3001 Acute cystitis with hematuria: Secondary | ICD-10-CM

## 2024-10-12 DIAGNOSIS — R319 Hematuria, unspecified: Secondary | ICD-10-CM

## 2024-10-12 DIAGNOSIS — E1165 Type 2 diabetes mellitus with hyperglycemia: Secondary | ICD-10-CM

## 2024-10-12 LAB — POCT URINE DIPSTICK
Bilirubin, UA: NEGATIVE
Glucose, UA: NEGATIVE mg/dL
Ketones, POC UA: NEGATIVE mg/dL
Leukocytes, UA: NEGATIVE
Nitrite, UA: POSITIVE — AB
Spec Grav, UA: 1.025
Urobilinogen, UA: 0.2 U/dL
pH, UA: 6

## 2024-10-12 NOTE — ED Notes (Signed)
 Pt and guest seen walking out of the ED

## 2024-10-12 NOTE — ED Triage Notes (Addendum)
 Pt reports being on anbx for 4 days for UTI.  Pain came back last night and found it more difficult to urinate and was passing. No fevers or chills.  Some nausea.   Sent here by PCP. Had urinalysis today there.

## 2024-10-12 NOTE — Progress Notes (Signed)
 Pt was advised to seek in person evaluation due to worsening urinary symptoms despite treatment with macrobid  for the past 4 days. No systemic symptoms indicating emergent evaluation. Will need to rule out resistant UTI, pyelonephritis, kidney stone or other underlying etiology contributing to symptoms. Verbalized understanding and will reach out to PCP, or go to UC if no availability today

## 2024-10-12 NOTE — Telephone Encounter (Addendum)
 See encounter.    See telehealth visit from today. Please advise . Patient reports she needed appt after lunch due to work.        FYI Only or Action Required?: FYI only for provider: appointment scheduled on 10/12/24.  Patient was last seen in primary care on 10/12/2024 by Malachy Comer GAILS, NP.  Called Nurse Triage reporting Dysuria.  Symptoms began several days ago.  Interventions attempted: Prescription medications: antibiotics and Rest, hydration, or home remedies.  Symptoms are: gradually worsening.  Triage Disposition: See HCP Within 4 Hours (Or PCP Triage)  Patient/caregiver understands and will follow disposition?: Yes                 Reason for Disposition  Diabetes mellitus or weak immune system (e.g., HIV positive, cancer chemo, splenectomy, organ transplant, chronic steroids)  Answer Assessment - Initial Assessment Questions Appt scheduled today with other provider due to none available with PCP. Patient reports she is not is severe pain and is urinating but looks like small blood clots. Instructed patient if she only urinates drops of urine >4  hours go to ED. Denies fever, no vomiting, no back or flank pain. Patient is diabetic. Recommended if she has pain today go to UC if needed.          1. SEVERITY: How bad is the pain?  (e.g., Scale 1-10; mild, moderate, or severe)     Comes and goes but severe at times. No pain now  2. FREQUENCY: How many times have you had painful urination today?      Burning with urination  4. ONSET: When did the painful urination start?      Friday and treated with antibiotics 5. FEVER: Do you have a fever? If Yes, ask: What is your temperature, how was it measured, and when did it start?     no 6. PAST UTI: Have you had a urine infection before? If Yes, ask: When was the last time? and What happened that time?      Yes  7. CAUSE: What do you think is causing the painful urination?  (e.g., UTI,  scratch, Herpes sore)     UTI 8. OTHER SYMPTOMS: Do you have any other symptoms? (e.g., blood in urine, flank pain, genital sores, urgency, vaginal discharge)     Small blood clots noted in urine today, burning with urination, low abdominal pain last night. Urinating small blood clots at times and small amounts of urine. Nausea at times. Did not sleep well last night due to pain.  Protocols used: Urination Pain - Female-A-AH

## 2024-10-12 NOTE — Progress Notes (Addendum)
 " Established patient visit  Patient: Dana Randolph   DOB: 12/03/1970   54 y.o. Female  MRN: [id number] Visit Date: 10/12/2024  Today's healthcare provider: Jolynn Spencer, PA-C   Chief Complaint  Patient presents with   Dysuria    Small blood clots noted in urine today, burning with urination, low abdominal pain last night. Urinating small blood clots at times and small amounts of urine. Nausea at times, cold sweat with the pain. Did not sleep well last night due to pain.Patient did an Randolph-Visit 10/07/2024 and was treated with Macrobid  for acute cystitis without hematuria. Patient has been taking OTC AZO   Subjective     HPI     Dysuria    Additional comments: Small blood clots noted in urine today, burning with urination, low abdominal pain last night. Urinating small blood clots at times and small amounts of urine. Nausea at times, cold sweat with the pain. Did not sleep well last night due to pain.Patient did an Randolph-Visit 10/07/2024 and was treated with Macrobid  for acute cystitis without hematuria. Patient has been taking OTC AZO      Last edited by Dana Randolph, CMA on 10/12/2024  4:02 PM.       Discussed the use of AI scribe software for clinical note transcription with the patient, who gave verbal consent to proceed.  History of Present Illness Dana Randolph is a 54 year old female who presents with severe lower abdominal pain and urinary symptoms.  She has sharp intermittent lower abdominal pain that sometimes radiates and can be so severe it doubles her over. The pain occurs both with and without urination.  She has difficulty emptying her bladder, passing only small amounts of urine despite increased fluid intake, and has passed small blood clots in her urine.  She was hospitalized for severe kidney infections in childhood and had a UTI within the past few years, with no known kidney stones.  She started Macrobid  4 days ago for presumed UTI with initial partial relief,  but her pain has since worsened and blood clots in her urine have appeared. She has 3 days of Macrobid  remaining.  She drinks 2 to 3 large cups of water daily and avoids sodas and tea. She drinks coffee in the mornings only.       10/12/2024    3:57 PM 07/28/2024    4:03 PM 01/28/2024    8:32 AM  Depression screen PHQ 2/9  Decreased Interest 0 0 0  Down, Depressed, Hopeless 0 0 0  PHQ - 2 Score 0 0 0  Altered sleeping  2 3  Tired, decreased energy  1 1  Change in appetite  0 0  Feeling bad or failure about yourself   0 0  Trouble concentrating  0 0  Moving slowly or fidgety/restless  0 0  Suicidal thoughts  0 0  PHQ-9 Score  3 4   Difficult doing work/chores  Not difficult at all Not difficult at all     Data saved with a previous flowsheet row definition      10/12/2024    3:57 PM 07/28/2024    4:03 PM 01/28/2024    8:32 AM 09/18/2023   12:18 PM  GAD 7 : Generalized Anxiety Score  Nervous, Anxious, on Edge 0 0  0    Control/stop worrying 0 0  0  1   Worry too much - different things 0 0  0  1   Trouble relaxing 1 0  0  1   Restless 2 0  0  1   Easily annoyed or irritable 2 0  0  1   Afraid - awful might happen 0 0  0  0   Total GAD 7 Score 5 0 0   Anxiety Difficulty Somewhat difficult  Not difficult at all Somewhat difficult     Data saved with a previous flowsheet row definition    Medications: Show/hide medication list[1]  Review of Systems All negative Except see HPI       Objective    BP (!) 151/87 (BP Location: Left Arm, Patient Position: Sitting, Cuff Size: Large)   Pulse 87   Wt 239 lb (108.4 kg)   SpO2 98%   BMI 39.77 kg/m     Physical Exam Vitals reviewed.  Constitutional:      General: She is not in acute distress.    Appearance: She is well-developed.  HENT:     Head: Normocephalic and atraumatic.  Eyes:     General: No scleral icterus.    Conjunctiva/sclera: Conjunctivae normal.  Cardiovascular:     Rate and Rhythm: Normal rate and  regular rhythm.     Heart sounds: Normal heart sounds. No murmur heard. Pulmonary:     Effort: Pulmonary effort is normal. No respiratory distress.     Breath sounds: Normal breath sounds. No wheezing or rales.  Abdominal:     General: There is no distension.     Palpations: Abdomen is soft.     Tenderness: There is abdominal tenderness (lower abdomen) in the suprapubic area. There is no guarding or rebound.  Skin:    General: Skin is warm and dry.     Capillary Refill: Capillary refill takes less than 2 seconds.     Findings: No rash.  Neurological:     Mental Status: She is alert and oriented to person, place, and time.  Psychiatric:        Behavior: Behavior normal.      No results found for any visits on 10/12/24.      Assessment & Plan Acute cystitis with hematuria and urinary retention Symptoms worsened despite Macrobid . Differential includes nephrolithiasis due to severe pain and urinary symptoms, UTI in diabetic patient,  diabetic autonomic neuropathy, bladder cancer. - Macrobid  is not associated to cause urinary retention. Has hx of d&C/hysteroscopy with polypectomy and minerva ablation for aub and endometrial polyp per obgyn note from 12.21.22 - Referred to emergency department for further evaluation , appropriate imaging and management. - Consider catheterization if obstruction is confirmed. - Administer pain medication for symptom relief.  DMII Chronic and stable Last A1c from 07/28/24 was 5.7 Currently on Trulicity  1.5mg  weekly Continue lifestyle modifications Will follow-up  Elevated BP reading BP of 151/87 Most likely due to severe pain Will follow-up  Perimenopausal Per pt is not on her period now   Orders Placed This Encounter  Procedures   Urine Culture   Urine Microscopic   POCT URINE DIPSTICK    No follow-ups on file.   The patient was advised to call back or seek an in-person evaluation if the symptoms worsen or if the condition fails to  improve as anticipated.  I discussed the assessment and treatment plan with the patient. The patient was provided an opportunity to ask questions and all were answered. The patient agreed with the plan and demonstrated an understanding of the instructions.  I, Lilliana Turner, PA-C have reviewed all documentation for this visit. The documentation on  10/12/2024  for the exam, diagnosis, procedures, and orders are all accurate and complete.  Jolynn Spencer, [facility], MMS [facility] [phone] (phone) [phone] (fax)  [facility]     [1]  Outpatient Medications Prior to Visit  Medication Sig   albuterol  (VENTOLIN  HFA) 108 (90 Base) MCG/ACT inhaler Inhale 1-2 puffs into the lungs every 6 (six) hours as needed.   amphetamine -dextroamphetamine  (ADDERALL XR) 30 MG 24 hr capsule Take 1 capsule (30 mg total) by mouth daily.   amphetamine -dextroamphetamine  (ADDERALL) 5 MG tablet Take 1 tablet (5 mg total) by mouth daily.   amphetamine -dextroamphetamine  (ADDERALL) 5 MG tablet Take 1 tablet (5 mg total) by mouth daily.   Dulaglutide  (TRULICITY ) 1.5 MG/0.5ML SOAJ Inject 1.5 mg into the skin once a week.   lisinopril  (ZESTRIL ) 5 MG tablet Take 0.5 tablets (2.5 mg total) by mouth daily.   nitrofurantoin , macrocrystal-monohydrate, (MACROBID ) 100 MG capsule Take 1 capsule (100 mg total) by mouth 2 (two) times daily for 7 days.   venlafaxine  XR (EFFEXOR  XR) 37.5 MG 24 hr capsule Take 1 capsule (37.5 mg total) by mouth daily with breakfast.   No facility-administered medications prior to visit.   "

## 2024-10-13 ENCOUNTER — Other Ambulatory Visit: Payer: Self-pay | Admitting: Physician Assistant

## 2024-10-13 ENCOUNTER — Other Ambulatory Visit: Payer: Self-pay

## 2024-10-13 ENCOUNTER — Ambulatory Visit: Payer: Self-pay | Admitting: Physician Assistant

## 2024-10-13 DIAGNOSIS — N3001 Acute cystitis with hematuria: Secondary | ICD-10-CM

## 2024-10-13 DIAGNOSIS — R319 Hematuria, unspecified: Secondary | ICD-10-CM

## 2024-10-13 DIAGNOSIS — R030 Elevated blood-pressure reading, without diagnosis of hypertension: Secondary | ICD-10-CM

## 2024-10-13 DIAGNOSIS — R339 Retention of urine, unspecified: Secondary | ICD-10-CM

## 2024-10-13 DIAGNOSIS — R399 Unspecified symptoms and signs involving the genitourinary system: Secondary | ICD-10-CM

## 2024-10-13 LAB — URINALYSIS, MICROSCOPIC ONLY
Bacteria, UA: NONE SEEN
Casts: NONE SEEN /LPF

## 2024-10-13 NOTE — Progress Notes (Unsigned)
 " Established patient visit  Patient: Dana Randolph   DOB: 05/23/1971   54 y.o. Female  MRN: [id number] Visit Date: 10/13/2024  Today's healthcare provider: Jolynn Spencer, PA-C   No chief complaint on file.  Subjective       Discussed the use of AI scribe software for clinical note transcription with the patient, who gave verbal consent to proceed.  History of Present Illness        10/12/2024    3:57 PM 07/28/2024    4:03 PM 01/28/2024    8:32 AM  Depression screen PHQ 2/9  Decreased Interest 0 0 0  Down, Depressed, Hopeless 0 0 0  PHQ - 2 Score 0 0 0  Altered sleeping  2 3  Tired, decreased energy  1 1  Change in appetite  0 0  Feeling bad or failure about yourself   0 0  Trouble concentrating  0 0  Moving slowly or fidgety/restless  0 0  Suicidal thoughts  0 0  PHQ-9 Score  3 4   Difficult doing work/chores  Not difficult at all Not difficult at all     Data saved with a previous flowsheet row definition      10/12/2024    3:57 PM 07/28/2024    4:03 PM 01/28/2024    8:32 AM 09/18/2023   12:18 PM  GAD 7 : Generalized Anxiety Score  Nervous, Anxious, on Edge 0 0  0    Control/stop worrying 0 0  0  1   Worry too much - different things 0 0  0  1   Trouble relaxing 1 0  0  1   Restless 2 0  0  1   Easily annoyed or irritable 2 0  0  1   Afraid - awful might happen 0 0  0  0   Total GAD 7 Score 5 0 0   Anxiety Difficulty Somewhat difficult  Not difficult at all Somewhat difficult     Data saved with a previous flowsheet row definition    Medications: Show/hide medication list[1]  Review of Systems All negative Except see HPI   {Insert previous labs (optional):23779} {See past labs  Heme  Chem  Endocrine  Serology  Results Review (optional):1}   Objective    There were no vitals taken for this visit. {Insert last BP/Wt (optional):23777}{See vitals history (optional):1}   Physical Exam   No results found for any visits on 10/13/24.       Assessment and Plan Assessment & Plan     No orders of the defined types were placed in this encounter.   No follow-ups on file.   The patient was advised to call back or seek an in-person evaluation if the symptoms worsen or if the condition fails to improve as anticipated.  I discussed the assessment and treatment plan with the patient. The patient was provided an opportunity to ask questions and all were answered. The patient agreed with the plan and demonstrated an understanding of the instructions.  I, Tayshun Gappa, PA-C have reviewed all documentation for this visit. The documentation on 10/13/2024  for the exam, diagnosis, procedures, and orders are all accurate and complete.  Jolynn Spencer, [facility], MMS [facility] [phone] (phone) [phone] (fax)  [facility]    [1]  Outpatient Medications Prior to Visit  Medication Sig   albuterol  (VENTOLIN  HFA) 108 (90 Base) MCG/ACT inhaler Inhale 1-2 puffs into the lungs every 6 (six) hours as  needed.   amphetamine -dextroamphetamine  (ADDERALL XR) 30 MG 24 hr capsule Take 1 capsule (30 mg total) by mouth daily.   amphetamine -dextroamphetamine  (ADDERALL) 5 MG tablet Take 1 tablet (5 mg total) by mouth daily.   amphetamine -dextroamphetamine  (ADDERALL) 5 MG tablet Take 1 tablet (5 mg total) by mouth daily.   Dulaglutide  (TRULICITY ) 1.5 MG/0.5ML SOAJ Inject 1.5 mg into the skin once a week.   lisinopril  (ZESTRIL ) 5 MG tablet Take 0.5 tablets (2.5 mg total) by mouth daily.   nitrofurantoin , macrocrystal-monohydrate, (MACROBID ) 100 MG capsule Take 1 capsule (100 mg total) by mouth 2 (two) times daily for 7 days.   venlafaxine  XR (EFFEXOR  XR) 37.5 MG 24 hr capsule Take 1 capsule (37.5 mg total) by mouth daily with breakfast.   No facility-administered medications prior to visit.   "

## 2024-10-14 ENCOUNTER — Ambulatory Visit: Admission: RE | Admit: 2024-10-14 | Payer: Self-pay

## 2024-10-14 ENCOUNTER — Other Ambulatory Visit: Payer: Self-pay | Admitting: Physician Assistant

## 2024-10-14 DIAGNOSIS — R339 Retention of urine, unspecified: Secondary | ICD-10-CM

## 2024-10-14 DIAGNOSIS — R319 Hematuria, unspecified: Secondary | ICD-10-CM

## 2024-10-14 DIAGNOSIS — N3001 Acute cystitis with hematuria: Secondary | ICD-10-CM

## 2024-10-14 LAB — COMPREHENSIVE METABOLIC PANEL WITH GFR
ALT: 19 [IU]/L (ref 0–32)
AST: 17 [IU]/L (ref 0–40)
Albumin: 4.2 g/dL (ref 3.8–4.9)
Alkaline Phosphatase: 104 [IU]/L (ref 49–135)
BUN/Creatinine Ratio: 19 (ref 9–23)
BUN: 15 mg/dL (ref 6–24)
Bilirubin Total: 0.5 mg/dL (ref 0.0–1.2)
CO2: 22 mmol/L (ref 20–29)
Calcium: 9.5 mg/dL (ref 8.7–10.2)
Chloride: 102 mmol/L (ref 96–106)
Creatinine, Ser: 0.81 mg/dL (ref 0.57–1.00)
Globulin, Total: 2.7 g/dL (ref 1.5–4.5)
Glucose: 139 mg/dL — ABNORMAL HIGH (ref 70–99)
Potassium: 4.9 mmol/L (ref 3.5–5.2)
Sodium: 140 mmol/L (ref 134–144)
Total Protein: 6.9 g/dL (ref 6.0–8.5)
eGFR: 88 mL/min/{1.73_m2}

## 2024-10-14 LAB — CBC WITH DIFFERENTIAL/PLATELET
Basophils Absolute: 0.1 10*3/uL (ref 0.0–0.2)
Basos: 1 %
EOS (ABSOLUTE): 0.1 10*3/uL (ref 0.0–0.4)
Eos: 2 %
Hematocrit: 43.1 % (ref 34.0–46.6)
Hemoglobin: 14 g/dL (ref 11.1–15.9)
Immature Grans (Abs): 0 10*3/uL (ref 0.0–0.1)
Immature Granulocytes: 0 %
Lymphocytes Absolute: 1.2 10*3/uL (ref 0.7–3.1)
Lymphs: 16 %
MCH: 27.4 pg (ref 26.6–33.0)
MCHC: 32.5 g/dL (ref 31.5–35.7)
MCV: 84 fL (ref 79–97)
Monocytes Absolute: 0.5 10*3/uL (ref 0.1–0.9)
Monocytes: 7 %
Neutrophils Absolute: 5.7 10*3/uL (ref 1.4–7.0)
Neutrophils: 74 %
Platelets: 322 10*3/uL (ref 150–450)
RBC: 5.11 x10E6/uL (ref 3.77–5.28)
RDW: 12.4 % (ref 11.7–15.4)
WBC: 7.6 10*3/uL (ref 3.4–10.8)

## 2024-10-14 LAB — URINE CULTURE

## 2024-11-03 ENCOUNTER — Ambulatory Visit: Payer: Self-pay | Admitting: Family Medicine
